# Patient Record
Sex: Male | Born: 1945 | Race: White | Hispanic: No | Smoking: Never smoker
Health system: Southern US, Community
[De-identification: ages and names within clinical notes are randomized; demographics above are authoritative.]

## PROBLEM LIST (undated history)

## (undated) DIAGNOSIS — I471 Supraventricular tachycardia, unspecified: Secondary | ICD-10-CM

## (undated) DIAGNOSIS — E785 Hyperlipidemia, unspecified: Secondary | ICD-10-CM

## (undated) DIAGNOSIS — I10 Essential (primary) hypertension: Secondary | ICD-10-CM

## (undated) DIAGNOSIS — C61 Malignant neoplasm of prostate: Secondary | ICD-10-CM

## (undated) DIAGNOSIS — K219 Gastro-esophageal reflux disease without esophagitis: Secondary | ICD-10-CM

## (undated) DIAGNOSIS — G4730 Sleep apnea, unspecified: Secondary | ICD-10-CM

## (undated) DIAGNOSIS — F419 Anxiety disorder, unspecified: Secondary | ICD-10-CM

## (undated) DIAGNOSIS — F32A Depression, unspecified: Secondary | ICD-10-CM

## (undated) DIAGNOSIS — N529 Male erectile dysfunction, unspecified: Secondary | ICD-10-CM

## (undated) DIAGNOSIS — E119 Type 2 diabetes mellitus without complications: Secondary | ICD-10-CM

## (undated) DIAGNOSIS — I219 Acute myocardial infarction, unspecified: Secondary | ICD-10-CM

## (undated) HISTORY — PX: PROSTATE BIOPSY: SHX241

## (undated) HISTORY — PX: FOOT SURGERY: SHX648

## (undated) HISTORY — PX: KNEE ARTHROSCOPY: SUR90

---

## 2011-07-03 ENCOUNTER — Other Ambulatory Visit (HOSPITAL_COMMUNITY): Payer: Self-pay | Admitting: Urology

## 2011-07-03 DIAGNOSIS — C61 Malignant neoplasm of prostate: Secondary | ICD-10-CM

## 2011-07-10 ENCOUNTER — Ambulatory Visit (HOSPITAL_COMMUNITY)
Admission: RE | Admit: 2011-07-10 | Discharge: 2011-07-10 | Disposition: A | Discharge status: Home / Self Care | Payer: BC Managed Care – HMO | Source: Ambulatory Visit | Attending: Urology | Admitting: Urology

## 2011-07-10 ENCOUNTER — Other Ambulatory Visit (HOSPITAL_COMMUNITY): Payer: Self-pay | Admitting: Urology

## 2011-07-10 DIAGNOSIS — C61 Malignant neoplasm of prostate: Secondary | ICD-10-CM | POA: Insufficient documentation

## 2011-07-10 DIAGNOSIS — Z1389 Encounter for screening for other disorder: Secondary | ICD-10-CM | POA: Insufficient documentation

## 2011-07-10 DIAGNOSIS — R599 Enlarged lymph nodes, unspecified: Secondary | ICD-10-CM | POA: Insufficient documentation

## 2011-07-10 LAB — CREATININE, SERUM
GFR calc Af Amer: >60 mL/min (ref >60)
GFR calc non Af Amer: >60 mL/min (ref >60)

## 2011-07-10 MED ORDER — GADOBENATE DIMEGLUMINE 529 MG/ML IV SOLN
20.0000 mL | Freq: Once | INTRAVENOUS | Status: AC | PRN
  Administered 2011-07-10: 20 mL via INTRAVENOUS

## 2013-03-06 DIAGNOSIS — G4733 Obstructive sleep apnea (adult) (pediatric): Secondary | ICD-10-CM | POA: Insufficient documentation

## 2015-06-05 DIAGNOSIS — N451 Epididymitis: Secondary | ICD-10-CM | POA: Insufficient documentation

## 2017-09-15 ENCOUNTER — Other Ambulatory Visit: Payer: Self-pay | Admitting: Urology

## 2017-09-15 DIAGNOSIS — C61 Malignant neoplasm of prostate: Secondary | ICD-10-CM

## 2017-09-22 ENCOUNTER — Ambulatory Visit
Admission: RE | Admit: 2017-09-22 | Discharge: 2017-09-22 | Disposition: A | Discharge status: Home / Self Care | Payer: Self-pay | Source: Ambulatory Visit | Attending: Urology | Admitting: Urology

## 2017-09-22 DIAGNOSIS — C61 Malignant neoplasm of prostate: Secondary | ICD-10-CM

## 2017-09-22 MED ORDER — GADOBENATE DIMEGLUMINE 529 MG/ML IV SOLN
20.0000 mL | Freq: Once | INTRAVENOUS | Status: DC | PRN
Start: 2017-09-22 — End: 2017-09-23

## 2019-10-09 ENCOUNTER — Other Ambulatory Visit: Payer: Self-pay | Admitting: Urology

## 2019-10-09 DIAGNOSIS — C61 Malignant neoplasm of prostate: Secondary | ICD-10-CM

## 2019-12-14 ENCOUNTER — Other Ambulatory Visit: Payer: Self-pay

## 2019-12-14 ENCOUNTER — Ambulatory Visit
Admission: RE | Admit: 2019-12-14 | Discharge: 2019-12-14 | Disposition: A | Discharge status: Home / Self Care | Payer: Medicare Other | Source: Ambulatory Visit | Attending: Urology | Admitting: Urology

## 2019-12-14 DIAGNOSIS — C61 Malignant neoplasm of prostate: Secondary | ICD-10-CM

## 2019-12-14 MED ORDER — GADOBENATE DIMEGLUMINE 529 MG/ML IV SOLN
20.0000 mL | Freq: Once | INTRAVENOUS | Status: AC | PRN
  Administered 2019-12-14: 20 mL via INTRAVENOUS

## 2019-12-20 ENCOUNTER — Other Ambulatory Visit (HOSPITAL_COMMUNITY): Payer: Self-pay | Admitting: Urology

## 2019-12-20 ENCOUNTER — Other Ambulatory Visit: Payer: Self-pay | Admitting: Urology

## 2019-12-20 DIAGNOSIS — C61 Malignant neoplasm of prostate: Secondary | ICD-10-CM

## 2019-12-25 ENCOUNTER — Encounter: Payer: Self-pay | Admitting: *Deleted

## 2020-01-01 ENCOUNTER — Other Ambulatory Visit: Payer: Self-pay

## 2020-01-01 ENCOUNTER — Encounter (HOSPITAL_COMMUNITY)
Admission: RE | Admit: 2020-01-01 | Discharge: 2020-01-01 | Disposition: A | Discharge status: Home / Self Care | Payer: Medicare Other | Source: Ambulatory Visit | Attending: Urology | Admitting: Urology

## 2020-01-01 ENCOUNTER — Telehealth: Payer: Self-pay | Admitting: Medical Oncology

## 2020-01-01 DIAGNOSIS — C61 Malignant neoplasm of prostate: Secondary | ICD-10-CM | POA: Insufficient documentation

## 2020-01-01 MED ORDER — TECHNETIUM TC 99M MEDRONATE IV KIT
20.4000 | PACK | Freq: Once | INTRAVENOUS | Status: AC
  Administered 2020-01-01: 20.4 via INTRAVENOUS

## 2020-01-01 NOTE — Telephone Encounter (Signed)
I called pt to introduce myself as the Prostate Nurse Navigator and the Coordinator of the Prostate [place].  1. I confirmed with the patient he is aware of his referral to the clinic 3/23, arriving at 12:30 pm.  2. I discussed the format of the clinic and the physicians he will be seeing that day.  3. I discussed where the clinic is located and how to contact me. I reviewed [place] parking, registration and COVID precautions.   4. I confirmed his address and informed him I would be mailing a packet of information and forms to be completed. I asked him to bring them with him the day of his appointment.   He voiced understanding of the above. I asked him to call me if he has any questions or concerns regarding his appointments or the forms he needs to complete.

## 2020-01-04 ENCOUNTER — Encounter: Payer: Self-pay | Admitting: Medical Oncology

## 2020-01-08 ENCOUNTER — Encounter: Payer: Self-pay | Admitting: Radiation Oncology

## 2020-01-08 ENCOUNTER — Telehealth: Payer: Self-pay | Admitting: Medical Oncology

## 2020-01-08 NOTE — Progress Notes (Signed)
GU Location of Tumor / Histology: prostatic adenocarcinoma  If Prostate Cancer, Gleason Score is (3 + 4) and PSA is (1.05) in November 2018.      Biopsies of prostate (if applicable) revealed:      Past/Anticipated interventions by urology, if any: prostate biopsies x 4, surveillance, bone scan (negative), referral to [facility], per Dr. Laney Pastor notes XRT is recommended due to patient's co morbidities.  Past/Anticipated interventions by medical oncology, if any: no  Weight changes, if any: denies  Bowel/Bladder complaints, if any: IPSS 6. SHIM 6. On avodart since 2012. Reports urinary incontinence. History of a hernia  Nausea/Vomiting, if any: denies  Pain issues, if any:  Joint pain  SAFETY ISSUES:  Prior radiation? denies  Pacemaker/ICD? denies  Possible current pregnancy? no, male patient  Is the patient on methotrexate? denies  Current Complaints / other details:  74 year old male. Widowed. Lost wife little more than 1 years ago to metastatic cancer. Mother had a hx of colon ca.  Last Tuesday 01/02/20 patient had an episode of SVT and had to be shocked back into rhythm. Patient scheduled to follow up with cardiologist on April 1st.

## 2020-01-08 NOTE — Telephone Encounter (Signed)
Spoke with patient to confirm appointment for [facility] 3/23, arriving at 12:30 pm. I reviewed location and registration. I asked him to bring completed medical forms in packet. He voiced understanding of the above.

## 2020-01-09 ENCOUNTER — Encounter: Payer: Self-pay | Admitting: Medical Oncology

## 2020-01-09 ENCOUNTER — Inpatient Hospital Stay: Payer: Medicare Other | Attending: Oncology | Admitting: Oncology

## 2020-01-09 ENCOUNTER — Encounter: Payer: Self-pay | Admitting: Radiation Oncology

## 2020-01-09 ENCOUNTER — Other Ambulatory Visit: Payer: Self-pay

## 2020-01-09 ENCOUNTER — Encounter: Payer: Self-pay | Admitting: General Practice

## 2020-01-09 ENCOUNTER — Ambulatory Visit
Admission: RE | Admit: 2020-01-09 | Discharge: 2020-01-09 | Disposition: A | Discharge status: Home / Self Care | Payer: Medicare Other | Source: Ambulatory Visit | Attending: Radiation Oncology | Admitting: Radiation Oncology

## 2020-01-09 VITALS — BP 171/85 | HR 75 | Temp 97.8°F | Resp 20 | Wt 302.0 lb

## 2020-01-09 DIAGNOSIS — C61 Malignant neoplasm of prostate: Secondary | ICD-10-CM | POA: Insufficient documentation

## 2020-01-09 DIAGNOSIS — Z923 Personal history of irradiation: Secondary | ICD-10-CM | POA: Diagnosis not present

## 2020-01-09 DIAGNOSIS — R9721 Rising PSA following treatment for malignant neoplasm of prostate: Secondary | ICD-10-CM | POA: Insufficient documentation

## 2020-01-09 DIAGNOSIS — N401 Enlarged prostate with lower urinary tract symptoms: Secondary | ICD-10-CM | POA: Diagnosis not present

## 2020-01-09 DIAGNOSIS — Z79899 Other long term (current) drug therapy: Secondary | ICD-10-CM | POA: Diagnosis not present

## 2020-01-09 HISTORY — DX: Essential (primary) hypertension: I10

## 2020-01-09 HISTORY — DX: Malignant neoplasm of prostate: C61

## 2020-01-09 NOTE — Progress Notes (Signed)
[place] Psychosocial Distress Screening Spiritual Care  Met with Brian Petersen and his son Brian Petersen in [facility] to introduce [facility] team/resources, reviewing distress screen per protocol.  The patient scored a [unspecified] on the Psychosocial Distress Thermometer which indicates [unspecified] distress. Also assessed for distress and other psychosocial needs.   ONCBCN DISTRESS SCREENING 01/09/2020  Screening Type Initial Screening  Family Problem type Other (comment)  Emotional problem type Depression;Isolation/feeling alone  Spiritual/Religous concerns type Relating to God;Loss of sense of purpose  Physical Problem type Pain  Referral to support programs Yes   [name] notes that most of his distress comes from grieving the death of his wife just a year ago. Provided empathic listening, pastoral reflection, and normalization of feelings related to grief and diagnosis. Encouraged support programming as a means to bridging social isolation.   Follow up needed: No. Per [name], no other needs at this time, but he is aware of ongoing [place] team/programming availability as needed/desired.   [place], [place], [facility] Pager [phone] Voicemail [phone]

## 2020-01-09 NOTE — Progress Notes (Signed)
Radiation Oncology         [phone] ________________________________  Multidisciplinary Prostate Cancer Clinic  Initial Radiation Oncology Consultation  Name: Brian Petersen MRN: [id number]  Date: 01/09/2020  DOB: 1945/11/23  [id number], Brian Rinks, MD  Brian Bring, MD   REFERRING PHYSICIAN: [name], MD  DIAGNOSIS: 74 y.o. gentleman with stage T1c adenocarcinoma of the prostate with a Gleason's score of 3+4 and a PSA of 5.44 (adjusted for Avodart)    ICD-10-CM   1. Malignant neoplasm of prostate ([place])  [name] is a 74 y.o. gentleman.  He was initially noted to have an elevated PSA of 4.3 back in 10/2010 by his PCP. He proceeded to biopsy on 11/13/2010 with Dr. Lerry Liner in [place], [place]., which revealed low volume Gleason 3+3 prostate cancer.  He was referred to Dr. Danny Lawless and Dr. Alinda Money in 04/2011 to discuss treatment options, and he opted for active surveillance. Prostate MRI performed in 06/2011 showed no transcapsular spread, seminal vesicle invasion, or adenopathy. He underwent surveillance biopsies in 06/2011, 11/2012, and 02/2015, which all showed stable Gleason 3+3 disease. He also started on Avodart in 07/2011 and has continued taking this daily.  On 09/22/2017, he underwent surveillance prostate MRI, which revealed a 1.2 cm PI-RADS 4 left posterolateral peripheral zone apical lesion, with prostate volume of 26.5 cc. He proceeded to MRI fusion biopsy on 10/05/2017, which revealed slight upstaging to Gleason 3+4 disease in 2/16 cores. He elected to continue in active surveillance.  His PSA continued to rise, from 1 in 02/2017 to 2.72 (5.4 corrected for finasteride) in 11/2019. He underwent surveillance prostate MRI on 12/14/2019, which revealed an enlarging PI-RADS 4 area showing suspicion for extracapsular extension and, based on location, possible neurovascular bundle involvement There was no evidence of lymphadenopathy or bone  metastases. Bone scan was performed on 01/01/2020 and also showed no evidence of skeletal metastasis. Based on review of imaging and discussion with radiology today in conference, these findings are felt to be equivocal at best, not definitive for ECE.  The patient reviewed the biopsy results with his urologist and he has kindly been referred today to the multidisciplinary prostate cancer clinic for presentation of pathology and radiology studies in our conference for discussion of potential radiation treatment options and clinical evaluation.    PREVIOUS RADIATION THERAPY: No  PAST MEDICAL HISTORY:  has a past medical history of Hypertension and Prostate cancer ([place]).    PAST SURGICAL HISTORY: Past Surgical History:  Procedure Laterality Date  . FOOT SURGERY    . KNEE ARTHROSCOPY    . PROSTATE BIOPSY     2018, 2014, 2012, 2012    FAMILY HISTORY: family history includes Colon cancer in his mother.  SOCIAL HISTORY:  reports that he has never smoked. He has never used smokeless tobacco. He reports previous alcohol use. He reports that he does not use drugs. He lost his wife to metastatic breast cancer in 2020.  ALLERGIES: Cyprodenate  MEDICATIONS:  Current Outpatient Medications  Medication Sig Dispense Refill  . albuterol (VENTOLIN HFA) 108 (90 Base) MCG/ACT inhaler Inhale into the lungs.    Marland Kitchen aspirin 325 MG tablet Take by mouth.    Marland Kitchen glucose blood (ONETOUCH ULTRA) test strip USE ONE STRIP TO CHECK GLUCOSE TWICE DAILY    . OneTouch Delica Lancets 99991111 MISC Apply topically.    . ALPRAZolam (XANAX) 1 MG tablet     . amLODipine (NORVASC) 5 MG tablet     .  baclofen (LIORESAL) 10 MG tablet     . clopidogrel (PLAVIX) 75 MG tablet     . dutasteride (AVODART) 0.5 MG capsule     . empagliflozin (JARDIANCE) 25 MG TABS tablet Take by mouth.    . ezetimibe (ZETIA) 10 MG tablet     . fenofibrate (TRICOR) 48 MG tablet     . furosemide (LASIX) 20 MG tablet     . gabapentin (NEURONTIN) 300 MG  capsule Take by mouth.    . Glucosamine-Chondroitin 250-200 MG TABS Take by mouth.    . hydrALAZINE (APRESOLINE) 100 MG tablet     . imipramine (TOFRANIL) 50 MG tablet     . KLOR-CON M20 20 MEQ tablet     . losartan-hydrochlorothiazide (HYZAAR) 100-25 MG tablet     . Magnesium Chloride 64 MG TBEC Take by mouth.    . metFORMIN (GLUCOPHAGE-XR) 750 MG 24 hr tablet Take by mouth.    . metoprolol succinate (TOPROL-XL) 50 MG 24 hr tablet     . pantoprazole (PROTONIX) 40 MG tablet     . rosuvastatin (CRESTOR) 20 MG tablet     . TOUJEO SOLOSTAR 300 UNIT/ML Solostar Pen      No current facility-administered medications for this encounter.    REVIEW OF SYSTEMS:  On review of systems, the patient reports that he is doing well overall. He denies any chest pain, shortness of breath, cough, fevers, chills, night sweats, unintended weight changes. He denies any bowel disturbances, and denies abdominal pain, nausea or vomiting. He denies any new musculoskeletal or joint aches or pains. His IPSS was 6, indicating mild urinary symptoms. He reports urinary urgency and some frequency. His SHIM was 6, indicating he has severe erectile dysfunction. A complete review of systems is obtained and is otherwise negative.   PHYSICAL EXAM:  Wt Readings from Last 3 Encounters:  01/09/20 (!) 302 lb (137 kg)  01/01/20 293 lb (132.9 kg)   Temp Readings from Last 3 Encounters:  01/09/20 97.8 F (36.6 C)   BP Readings from Last 3 Encounters:  01/09/20 (!) 171/85   Pulse Readings from Last 3 Encounters:  01/09/20 75   Pain Assessment Pain Score: 0-No pain/10  In general this is a well appearing Caucasian male in no acute distress. He is alert and oriented x4 and appropriate throughout the examination. HEENT reveals that the patient is normocephalic, atraumatic. EOMs are intact. PERRLA. Skin is intact without any evidence of gross lesions. Cardiovascular exam reveals a regular rate and rhythm, no clicks rubs or  murmurs are auscultated. Chest is clear to auscultation bilaterally. Lymphatic assessment is performed and does not reveal any adenopathy in the cervical, supraclavicular, axillary, or inguinal chains. Abdomen has active bowel sounds in all quadrants and is intact. The abdomen is soft, non tender, non distended. Lower extremities are negative for pretibial pitting edema, deep calf tenderness, cyanosis or clubbing.  KPS = 80  100 - Normal; no complaints; no evidence of disease. 90   - Able to carry on normal activity; minor signs or symptoms of disease. 80   - Normal activity with effort; some signs or symptoms of disease. 33   - Cares for self; unable to carry on normal activity or to do active work. 60   - Requires occasional assistance, but is able to care for most of his personal needs. 50   - Requires considerable assistance and frequent medical care. 52   - Disabled; requires special care and assistance. 30   -  Severely disabled; hospital admission is indicated although death not imminent. 73   - Very sick; hospital admission necessary; active supportive treatment necessary. 10   - Moribund; fatal processes progressing rapidly. 0     - Dead  Karnofsky DA, Abelmann WH, Craver LS and Burchenal JH [phone]) The use of the nitrogen mustards in the palliative treatment of carcinoma: with particular reference to bronchogenic carcinoma Cancer 1 [id number]   LABORATORY DATA:  No results found for: WBC, HGB, HCT, MCV, PLT No results found for: NA, K, CL, CO2 No results found for: ALT, AST, GGT, ALKPHOS, BILITOT   RADIOGRAPHY: NM Bone Scan Whole Body  Result Date: 01/01/2020 CLINICAL DATA:  Prostate cancer.  PSA equal 2.7. EXAM: NUCLEAR MEDICINE WHOLE BODY BONE SCAN TECHNIQUE: Whole body anterior and posterior images were obtained approximately 3 hours after intravenous injection of radiopharmaceutical. RADIOPHARMACEUTICALS:  20.4 mCi Technetium-58m MDP IV COMPARISON:  None. FINDINGS: No abnormal  accumulation of radiotracer within the axillary or appendicular skeleton to suggest metastatic prostate cancer. Focal uptake at the LEFT sternoclavicular junction is favored degenerative. Degenerate uptake noted along the costal margin. Degenerate uptake noted in the medial compartment LEFT knee. IMPRESSION: 1. No scintigraphic evidence skeletal metastasis. 2. Multiple foci of degenerative bone disease. Electronically Signed   By: Suzy Bouchard M.D.   On: 01/01/2020 16:38   MR PROSTATE W WO CONTRAST  Result Date: 12/14/2019 CLINICAL DATA:  Elevated PSA. Prior biopsy. EXAM: MR PROSTATE WITHOUT AND WITH CONTRAST TECHNIQUE: Multiplanar multisequence MRI images were obtained of the pelvis centered about the prostate. Pre and post contrast images were obtained. CONTRAST:  72mL MULTIHANCE GADOBENATE DIMEGLUMINE 529 MG/ML IV SOLN Creatinine was obtained on site at [facility] at [address] Results: Creatinine 0.9 mg/dL. COMPARISON:  September 22, 2017 FINDINGS: Prostate: Area of T2 hypointensity in the left mid gland tracking towards the apex, associated with low signal on ADC and mild capsular bulging of the left hemi prostate. This capsular bulging is best identified on image 14 of series 9. This area has enlarged since the previous study, measuring approximately 13 mm on the T2 weighted sequence, (image 13, series 20) previously 8-9 mm. When measured on the ADC this area measures approximately 10 mm. Craniocaudal extent on today's study approximately 13 mm. This is also associated with subtle restricted diffusion on the high B value sequence though there is limitation of the sequence secondary to susceptibility artifact. There is early enhancement in this location as well also appearing slightly larger than on the previous exam. (Image 14, series 9) irregularity along the capsule adjacent to the neurovascular bundle similar findings on the isotropic T2 weighted images, (image 86 of series 10 along  the left prosthetic margin, also image 87. This is at approximately the 5 to 7 o'clock position. No high-risk lesion in the transitional zone Volume: 29 cc Transcapsular spread: Suspected along the left prostate margin towards the apex as described above. Seminal vesicle involvement: Absent but with stable atrophy of the left seminal vesicle. Neurovascular bundle involvement: Left neurovascular bundle at risk given findings towards the left prostate apex. Pelvic adenopathy: Absent Bone metastasis: Absent Other findings: None IMPRESSION: 1. Enlarging PIRADS category 4 area showing suspicion for extracapsular extension and based on location neurovascular bundle involvement. 2. No evidence of lymphadenopathy or bone metastases. 3. 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 Electronically Signed   By: Zetta Bills M.D.   On: 12/14/2019 11:56      IMPRESSION/PLAN: 74 y.o. gentleman with  Stage T1c adenocarcinoma of the prostate with a PSA of 5.44 (adjusted for Avodart) and a Gleason score of 3+4.    We discussed the patient's workup and outlined the nature of prostate cancer in this setting. The patient's T stage, Gleason's score, and PSA put him into the favorable intermediate risk group. Accordingly, he is eligible for a variety of potential treatment options including brachytherapy, 5.5 weeks of external radiation, or prostatectomy. We discussed the available radiation techniques, and focused on the details and logistics of delivery. We discussed and outlined the risks, benefits, short and long-term effects associated with radiotherapy and compared and contrasted these with prostatectomy. We discussed the role of SpaceOAR in reducing the rectal toxicity associated with radiotherapy.  He was encouraged to ask questions that were answered to his stated satisfaction.  At the end of our conversation, the patient would like to proceed with 5.5 weeks of external beam radiation therapy in [place], [place] which  would be much closer to his home. We will therefore make the referral to Dr. Bess Harvest in [place]. We enjoyed meeting the patient today and wished him the best with his treatment.     Nicholos Johns, PA-C    Tyler Pita, MD  [facility] Direct Dial: [phone]  Fax: [phone] [email or web address]  Skype  [email or web address]   This document serves as a record of services personally performed by Tyler Pita, MD and Freeman Caldron, PA-C. It was created on their behalf by Wilburn Mylar, a trained medical scribe. The creation of this record is based on the scribe's personal observations and the provider's statements to them. This document has been checked and approved by the attending provider.

## 2020-01-09 NOTE — Consult Note (Signed)
[facility] 01/09/2020    Alphonse Guild. [place]         MRN: [id number]  PRIMARY CARE:  Cathlean Cower, MD    REFERRING:  Aloha Gell. Hurt, MD  DOB: 1946-08-03, 74 year old Male  PROVIDER:  [name], M.D.  SSN: [id number]  LOCATION:  [facility] [phone]    CC/HPI: CC: Prostate Cancer   PCP: Dr. Cathlean Cower  Location of consult: [facility]   Mr. Cuvelier is a 74 year old gentleman with a past medical history of coronary artery disease s/p myocardial infarction in 2001 (on Plavix and ASA but does not have stents), hyperlipidemia, hypertension, GERD, sleep apnea, diabetes, and obesity. He was initially noted to have an elevated PSA of 4.3 in January 2012 by his PCP at the time, Dr. Lonia Mad in [place], which prompted urologic referral to Dr. Clide Deutscher. Dr. 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 He underwent staging studies by Dr. Lerry Liner on 12/02/10 including a bone scan and CT scan with neither study demonstrating evidence for metastatic disease. He has no family history of prostate cancer and his father died of a heart attack at age 80. He has discussed his options for treatment/management with Dr. Lerry Liner and Dr. Danny Lawless in radiation oncology prior to his initial consultation with me in July 2012. After reviewing options, he was interested in an active surveillance approach. He was managed with active surveillance until December 2018 when he was noted to have progression with upgraded Gleason 3+4=7 adenocarcinoma noted on biopsy. In August 2020, his PSA increased form a baseline of around 1 up to 2.7 (on 5 ARI). This then stabilized and was 2.72 in January 2021 but prompted a repeat MRI of the prostate in February 2021 that confirmed a  PIRADS 4 lesion but now with probable EPE suggesting development of locally advanced disease. He did undergo recent bone scan based on his concern for locally advanced disease. This was negative for metastatic disease.   Of note, his wife passed away last year and he has been quite depressed and lonely. He is now very interested in starting a new relationship for this reason. He has refractory erectile dysfunction and expressed strong interest in a penile prosthesis at his last visit, which may be his only viable option for treatment.   Notably, he also informs me that he had an episode of SVT requiring evaluation the emergency department last week. He is scheduled to follow-up with cardiology next week.   Surveillance:  Sep 2012 (26 core biopsy): 4/26 cores -- L lateral apex (20%, 3+3=6), L lateral mid (15%, 3+3=6, PNI), L mid (40% and 10% with 2/2 cores, 3+3=6), Vol 60 cc  Sep 2012 (MRI of the prostate): Hypointensity at left lateral mid/apical prostate. No SVI, EPE, or LAD. No transition zone abnormalities.  Feb 2014: 12 core biopsy: 2/12 cores - L lateral apex (25%, 3+3=6), L lateral mid (5%, 3+3=6), Vol 38.5 cc  May 2016: 12 core biopsy: 1/12 cores - L apex (30%, 3+3=6, PNI), Vol 41.4 cc  Dec 2018: MRI of prostate - 1.2 cm PI-RADS 4 lesion at left posterolateral apex  Dec 2018: MR/US fusion biopsy -2/16 cores positive - L mid (10%, 3+4=7), L lateral mid (30%, 3+4=7), MR  target lesion (negative), Vol 32.1 cc, PSAD 0.22 Nov 2019: MRI - 1.3 cm PIRADS 4 lesion at left posterolateral prostate with evidence of probably EPE   Family history: None.    Urinary function: IPSS is 6. He remains on dutasteride  Erectile function: SHIM score is 6. He has failed PDE 5 inhibitors.     ALLERGIES: Ceftin TABS    MEDICATIONS: Crestor  Hydrochlorothiazide  Metformin Hcl  Metformin Hcl  Aspirin Ec 325 mg tablet, delayed release  Baclofen  Cinnamon  Clopidogrel  CPAP  Dutasteride 0.5 mg capsule    Ezetimibe  Fenofibrate  Fish Oil  Furosemide 20 mg tablet Oral  Hydralazine Hcl  Imipramine Hcl  Klor-Con M20 TBCR Oral  Losartan Potassium-HCTZ 100-25 MG Oral Tablet Oral  Magnesium  Metoprolol Tartrate 50 mg tablet Oral  Onetouch Ultra Blue Test Strp strip In Vitro  Osteo Bi-Flex Regular Strength TABS Oral  Pantoprazole Sodium  Rosuvastatin Calcium  Toujeo Solostar  Turmeric  Vitamin B Complex  Vitamin B12  Xanax 1 mg tablet  Zinc     GU PSH: Prostate Needle Biopsy - 10/06/2017       PSH Notes: Arthroscopy Knee Right, Foot Surgery Left, Closed Treatment Of Clavicular Fracture On The Left   NON-GU PSH: Knee Arthroscopy; Dx - 2012 Surgical Pathology, Gross And Microscopic Examination For Prostate Needle - 10/06/2017 Treat Clavicle Fracture - 2012         GU PMH: Weak Urinary Stream - 12/19/2019 Hematospermia - 2017 BPH w/LUTS, Benign prostatic hyperplasia (BPH) with straining on urination - 2017 ED due to arterial insufficiency, Erectile dysfunction due to arterial insufficiency - 2017 Prostate Cancer, Prostate cancer - 2017 Epididymitis, Epididymitis, left - 2016 Urinary incontinence, Unspec, Urinary incontinence - 2016 Urinary Tract Inf, Unspec site, Pyuria - 2016 Urinary Urgency, Urinary urgency - 2016 History of urolithiasis, Nephrolithiasis - 2014 Neoplasm of unspecified behavior of unspecified kidney, 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 Calton Dach which prompted urologic referral to Dr. Lerry Liner. Dr. 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 He underwent staging studies by Dr. Lerry Liner on 12/02/10 including a bone scan  and CT scan with neither study demonstrating evidence for metastatic disease. He has no family history of prostate cancer and his father died of a heart attack at age 65. He has discussed his options for treatment/management with Dr. Lerry Liner and Dr. Danny Lawless in radiation oncology prior to his initial consultation with me in July 2012. After reviewing options, he was interested in an active surveillance approach and underwent further evaluation including repeat biopsy and MRI evaluation.   He was managed with active surveillance until December 2018 when he was noted to have progression with upgraded Gleason 3+4=7 adenocarcinoma noted on biopsy.   Initial diagnosis: January 2012  TNM stage: cT1c N0 M0  PSA at diagnosis: 4.3  Gleason score: 3+3=6  Biopsy (11/13/10 - read by Dr. Gari Crown at [facility], Case # [phone]): 1/12 cores -- Left sided biopsy tissue (< 10%), Right sided biopsy tissue (benign)  Prostate volume: 27 cc  PSAD: 0.16   Baseline urinary function: He has minimal voiding symptoms. IPSS:  3.  Baseline erectile function: He has preserved erectile dysfunction. SHIM score: 24.   Surveillance:  Sep 2012 (26 core biopsy): 4/26 cores -- L lateral apex (20%, 3+3=6), L lateral mid (15%, 3+3=6, PNI), L mid (40% and 10% with 2/2 cores, 3+3=6), Vol 60 cc  Sep 2012 (MRI of the prostate): Hypointensity at left lateral mid/apical prostate. No SVI, EPE, or LAD. No transition zone abnormalities.  Feb 2014: 12 core biopsy: 2/12 cores - L lateral apex (25%, 3+3=6), L lateral mid (5%, 3+3=6), Vol 38.5 cc  May 2016: 12 core biopsy: 1/12 cores - L apex (30%, 3+3=6, PNI), Vol 41.4 cc  Dec 2018: MRI of prostate - 1.2 cm PI-RADS 4 lesion at left posterolateral apex  Dec 2018: MR/US fusion biopsy -2/16 cores positive - L mid (10%, 3+4=7), L lateral mid (30%, 3+4=7), MR target lesion (negative), Vol 32.1 cc, PSAD 0.03    2) Urolithiasis: He also has a history of calcium oxalate urolithiasis. He most  recently was found to have incidentally detected bilateral ureteral stones during his metastatic staging studies for his prostate cancer. He underwent ureteroscopic removal of his left ureteral stone but his right ureteral stone was unable to be removed. He required multiple procedures with difficulty reported due to his prostate anatomy which made semirigid ureteroscopy difficult. He apparently had an IVP in May 2012 after these procedures which did not demonstrate any residual obstructing stones.   3) Left complex renal cyst: In February 2012, he was incidentally found to have bilateral renal cysts with a 1.7 cm hyperdense cyst which was recommended to be followed. 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   4) Erectile dysfunction: He has erectile dysfunction which has been refractory to treatment with Cialis and Levitra on demand medications.   5) BPH/LUTS: His baseline symptoms include urgency and spraying of his stream intermittently.   Current treatment: Avodart (Began Oct 2012)   ** He has a history of coronary artery disease and had a myocardial infarction in 2001. He has no cardiac stents but is chronically treated with Plavix and aspirin.     NON-GU PMH: Depression Diabetes Type 2 GERD Hypercholesterolemia Hypertension Myocardial Infarction Obesity Sleep Apnea    FAMILY HISTORY: Colon Cancer - Mother Father Deceased At Age67 ___ - Runs In Family Heart Disease - Father nephrolithiasis - Mother Prostate Cancer - No Family History   SOCIAL HISTORY: Marital Status: Married Preferred Language: English; Ethnicity: Not Hispanic Or Latino; Race: White Current Smoking Status: Patient has never smoked.  Drinks 4+ caffeinated drinks per day.     Notes: Occupation:, Marital History - Currently Married, Alcohol Use, Never A Smoker   REVIEW OF SYSTEMS:     GU Review Male:  Patient denies frequent  urination, hard to postpone urination, burning/ pain with urination, get up at night to urinate, leakage of urine, stream starts and stops, trouble starting your streams, and have to strain to urinate .    Gastrointestinal (Upper):  Patient denies nausea and vomiting.    Gastrointestinal (Lower):  Patient denies diarrhea and constipation.    Constitutional:  Patient denies fever, night sweats, weight loss, and fatigue.    Skin:  Patient denies skin rash/ lesion and itching.    Eyes:  Patient denies blurred vision and double vision.    Ears/ Nose/ Throat:  Patient denies sore throat and sinus problems.    Hematologic/Lymphatic:  Patient denies swollen glands  and easy bruising.    Cardiovascular:  Patient denies leg swelling and chest pains.    Respiratory:  Patient denies cough and shortness of breath.    Endocrine:  Patient denies excessive thirst.    Musculoskeletal:  Patient denies back pain and joint pain.    Neurological:  Patient denies headaches and dizziness.    Psychologic:  Patient denies depression and anxiety.    VITAL SIGNS: None     MULTI-SYSTEM PHYSICAL EXAMINATION:      Constitutional: Well-nourished. No physical deformities. Normally developed. Good grooming.            PAST DATA REVIEWED:   Source Of History:  Patient    12/11/19 08/02/19 06/16/19 11/11/18 05/04/18 09/15/17 02/26/17 08/26/16  PSA  Total PSA 2.72 ng/mL 2.6 ng/dl 2.71 ng/mL 1.94 ng/mL 1.26 ng/mL 1.05 ng/mL 1.04 ng/dl 0.62 ng/dl    PROCEDURES: None   ASSESSMENT:     ICD-10 Details  1 GU:  Prostate Cancer - C61   2  BPH w/LUTS - N40.1   3  Weak Urinary Stream - R39.12    PLAN:   Document  Letter(s):  Created for Patient: Clinical Summary   Notes:  1. Intermediate risk prostate cancer: I had a long and detailed discussion with Mr. Mccreedy and his son today. We reviewed his imaging and pathology extensively in our tumor Board Conference prior to his visit today. He does have some equivocal concern  for possible early development of extraprostatic extension although this is far from definitive. We reviewed the option of ongoing surveillance versus treatment. He understands that there may be an increased risk for progression of his disease based on some of these findings concerning his imaging. After further discussion and our prior discussions, he does wish to proceed with definitive therapy for cure at this time.   The patient was counseled about the natural history of prostate cancer and the standard treatment options that are available for prostate cancer. It was explained to him how his age and life expectancy, clinical stage, Gleason score, and PSA affect his prognosis, the decision to proceed with additional staging studies, as well as how that information influences recommended treatment strategies. We discussed the roles for active surveillance, radiation therapy, surgical therapy, androgen deprivation, as well as ablative therapy options for the treatment of prostate cancer as appropriate to his individual cancer situation. We discussed the risks and benefits of these options with regard to their impact on cancer control and also in terms of potential adverse events, complications, and impact on quality of life particularly related to urinary and sexual function. The patient was encouraged to ask questions throughout the discussion today and all questions were answered to his stated satisfaction. In addition, the patient was provided with and/or directed to appropriate resources and literature for further education about prostate cancer and treatment options.   After reviewing options for treatment, he understands the increased perioperative risks for surgical therapy considering his medical comorbidities as well as the increased risk of incontinence in his 89s. We discussed the option of primary radiotherapy. Although there is some mild concern for possible extraprostatic disease, it is not  definitive that he has locally advanced disease. Furthermore, considering the recent loss of his wife which has resulted in some depression/mood changes, the fact that he has cardiovascular disease and diabetes, and his concern about improving his erectile function for his quality of life so that he can feel more confident potentially starting a new relationship, we discussed  the potential risks and benefits of androgen deprivation therapy with radiation and I agreed with him that the risks may outweigh the benefits for him.   As such, he is most interested in proceeding with radiotherapy alone without androgen deprivation. He is scheduled to meet with Dr. Tammi Klippel later this afternoon. He would likely want to proceed with radiation therapy closer to home in [place], [place]. Assuming this is the case, I will communicate with his radiation oncologist and see how I can assist. I will plan to arrange appropriate follow-up for ongoing cancer surveillance and further evaluation of his lower urinary tract symptoms post treatment.   2. BPH/LUTS: His symptoms remain relatively well controlled. Continue dutasteride 0.5 mg.   3. Erectile dysfunction: He does wish to pursue further treatment for his erectile dysfunction. He has failed PDE 5 inhibitors and has not been keen on a vacuum erection device nor intracavernosal injection therapy when previously tried. He therefore may be most interested in proceeding with surgical treatment with a penile prosthesis. We have agreed to discuss his further following treatment of his prostate cancer to further assess his desire to proceed with aggressive treatment.   Cc: Dr. Cathlean Cower  Dr. Tyler Pita  Dr. Zola Button  Dr. Bess Harvest    E & M CODES: I spent 38 minutes total time with the patient.

## 2020-01-09 NOTE — Progress Notes (Signed)
Reason for the request: Prostate cancer     HPI: I was asked by Dr. Alinda Money to evaluate Mr. Moravec for evaluation of prostate cancer.  He is a 74 year old man with history of hypertension, diabetes and coronary artery disease and was diagnosed with prostate cancer dating back to 2012.  At that time he had a PSA 4.3 biopsy obtained by Dr. Lerry Liner showed a Gleason score of 3+3 equal 6 and 1 of 12 cores.  He had been on active surveillance since that time and has had multiple biopsies in the past.  In 2018 he had an MRI of the prostate which showed a PI-RADS 4 in the left posterior lateral apex which should resulted in a biopsy which showed 2016 cores +3 +4 equal 7 in the left mid and the left mid lateral prostate.  His PSA at that time was 1.07.  Subsequently his PSA was as high as 2.72 in February 2021.  Repeat MRI showed a enlarging PI-RADS category 4 lesion with questionable extracapsular extension.  Based on these findings he was referred to the prostate cancer multidisciplinary clinic to discuss treatment options.  He did have a bone scan obtained on March 15 which showed no evidence of metastatic disease.  Clinically, he remains active and continues to have many projects.  He denies any specific urinary symptoms at this time although he does report some urinary frequency and nocturia but he is on Lasix.  He has also battled issues with depression related to the death of his wife in the last year.  He does not report any headaches, blurry vision, syncope or seizures. Does not report any fevers, chills or sweats.  Does not report any cough, wheezing or hemoptysis.  Does not report any chest pain, palpitation, orthopnea or leg edema.  Does not report any nausea, vomiting or abdominal pain.  Does not report any constipation or diarrhea.  Does not report any skeletal complaints.    Does not report frequency, urgency or hematuria.  Does not report any skin rashes or lesions. Does not report any heat or cold  intolerance.  Does not report any lymphadenopathy or petechiae.  Does not report any anxiety or depression.  Remaining review of systems is negative.    Past Medical History:  Diagnosis Date  . Hypertension   . Prostate cancer [facility])   :  Past Surgical History:  Procedure Laterality Date  . FOOT SURGERY    . KNEE ARTHROSCOPY    . PROSTATE BIOPSY     2018, 2014, 2012, 2012  :   Current Outpatient Medications:  .  albuterol (VENTOLIN HFA) 108 (90 Base) MCG/ACT inhaler, Inhale into the lungs., Disp: , Rfl:  .  ALPRAZolam (XANAX) 1 MG tablet, , Disp: , Rfl:  .  amLODipine (NORVASC) 5 MG tablet, , Disp: , Rfl:  .  aspirin 325 MG tablet, Take by mouth., Disp: , Rfl:  .  baclofen (LIORESAL) 10 MG tablet, , Disp: , Rfl:  .  clopidogrel (PLAVIX) 75 MG tablet, , Disp: , Rfl:  .  dutasteride (AVODART) 0.5 MG capsule, , Disp: , Rfl:  .  empagliflozin (JARDIANCE) 25 MG TABS tablet, Take by mouth., Disp: , Rfl:  .  ezetimibe (ZETIA) 10 MG tablet, , Disp: , Rfl:  .  fenofibrate (TRICOR) 48 MG tablet, , Disp: , Rfl:  .  furosemide (LASIX) 20 MG tablet, , Disp: , Rfl:  .  gabapentin (NEURONTIN) 300 MG capsule, Take by mouth., Disp: , Rfl:  .  Glucosamine-Chondroitin 250-200 MG TABS, Take by mouth., Disp: , Rfl:  .  glucose blood (ONETOUCH ULTRA) test strip, USE ONE STRIP TO CHECK GLUCOSE TWICE DAILY, Disp: , Rfl:  .  hydrALAZINE (APRESOLINE) 100 MG tablet, , Disp: , Rfl:  .  imipramine (TOFRANIL) 50 MG tablet, , Disp: , Rfl:  .  KLOR-CON M20 20 MEQ tablet, , Disp: , Rfl:  .  losartan-hydrochlorothiazide (HYZAAR) 100-25 MG tablet, , Disp: , Rfl:  .  Magnesium Chloride 64 MG TBEC, Take by mouth., Disp: , Rfl:  .  metFORMIN (GLUCOPHAGE-XR) 750 MG 24 hr tablet, Take by mouth., Disp: , Rfl:  .  metoprolol succinate (TOPROL-XL) 50 MG 24 hr tablet, , Disp: , Rfl:  .  OneTouch Delica Lancets [id number] MISC, Apply topically., Disp: , Rfl:  .  pantoprazole (PROTONIX) 40 MG tablet, , Disp: , Rfl:  .   rosuvastatin (CRESTOR) 20 MG tablet, , Disp: , Rfl:  .  TOUJEO SOLOSTAR 300 UNIT/ML Solostar Pen, , Disp: , Rfl: :  Allergies  Allergen Reactions  . Cyprodenate   :  Family History  Problem Relation Age of Onset  . Colon cancer Mother   . Prostate cancer Neg Hx   . Pancreatic cancer Neg Hx   . Breast cancer Neg Hx   :  Social History   Socioeconomic History  . Marital status: Widowed    Spouse name: Not on file  . Number of children: 2  . Years of education: Not on file  . Highest education level: Not on file  Occupational History    Comment: retired   Tobacco Use  . Smoking status: Never Smoker  . Smokeless tobacco: Never Used  Substance and Sexual Activity  . Alcohol use: Not Currently  . Drug use: Never  . Sexual activity: Not Currently  Other Topics Concern  . Not on file  Social History Narrative   Patient lost wife to cancer in 2020. Patient has two adult children, Race ([place], [place]) and Lenna [place]).   Social Determinants of Health   Financial Resource Strain:   . Difficulty of Paying Living Expenses:   Food Insecurity:   . Worried About Charity fundraiser in the Last Year:   . Arboriculturist in the Last Year:   Transportation Needs:   . Film/video editor (Medical):   Marland Kitchen Lack of Transportation (Non-Medical):   Physical Activity:   . Days of Exercise per Week:   . Minutes of Exercise per Session:   Stress:   . Feeling of Stress :   Social Connections:   . Frequency of Communication with Friends and Family:   . Frequency of Social Gatherings with Friends and Family:   . Attends Religious Services:   . Active Member of Clubs or Organizations:   . Attends Archivist Meetings:   Marland Kitchen Marital Status:   Intimate Partner Violence:   . Fear of Current or Ex-Partner:   . Emotionally Abused:   Marland Kitchen Physically Abused:   . Sexually Abused:   :  Pertinent items are noted in HPI.  Exam:  General appearance: alert and cooperative  appeared without distress. Head: atraumatic without any abnormalities. Eyes: conjunctivae/corneas clear. PERRL.  Sclera anicteric. Throat: lips, mucosa, and tongue normal; without oral thrush or ulcers. Resp: clear to auscultation bilaterally without rhonchi, wheezes or dullness to percussion. Cardio: regular rate and rhythm, S1, S2 normal, no murmur, click, rub or gallop GI: soft, non-tender; bowel sounds normal; no masses,  no organomegaly Skin: Skin color, texture, turgor normal. No rashes or lesions Lymph nodes: Cervical, supraclavicular, and axillary nodes normal. Neurologic: Grossly normal without any motor, sensory or deep tendon reflexes. Musculoskeletal: No joint deformity or effusion.    NM Bone Scan Whole Body  Result Date: 01/01/2020 CLINICAL DATA:  Prostate cancer.  PSA equal 2.7. EXAM: NUCLEAR MEDICINE WHOLE BODY BONE SCAN TECHNIQUE: Whole body anterior and posterior images were obtained approximately 3 hours after intravenous injection of radiopharmaceutical. RADIOPHARMACEUTICALS:  20.4 mCi Technetium-42m MDP IV COMPARISON:  None. FINDINGS: No abnormal accumulation of radiotracer within the axillary or appendicular skeleton to suggest metastatic prostate cancer. Focal uptake at the LEFT sternoclavicular junction is favored degenerative. Degenerate uptake noted along the costal margin. Degenerate uptake noted in the medial compartment LEFT knee. IMPRESSION: 1. No scintigraphic evidence skeletal metastasis. 2. Multiple foci of degenerative bone disease. Electronically Signed   By: Suzy Bouchard M.D.   On: 01/01/2020 16:38   MR PROSTATE W WO CONTRAST  Result Date: 12/14/2019 CLINICAL DATA:  Elevated PSA. Prior biopsy. EXAM: MR PROSTATE WITHOUT AND WITH CONTRAST TECHNIQUE: Multiplanar multisequence MRI images were obtained of the pelvis centered about the prostate. Pre and post contrast images were obtained. CONTRAST:  20mL MULTIHANCE GADOBENATE DIMEGLUMINE 529 MG/ML IV SOLN  Creatinine was obtained on site at [place] at [address] Results: Creatinine 0.9 mg/dL. COMPARISON:  September 22, 2017 FINDINGS: Prostate: Area of T2 hypointensity in the left mid gland tracking towards the apex, associated with low signal on ADC and mild capsular bulging of the left hemi prostate. This capsular bulging is best identified on image 14 of series 9. This area has enlarged since the previous study, measuring approximately 13 mm on the T2 weighted sequence, (image 13, series 20) previously 8-9 mm. When measured on the ADC this area measures approximately 10 mm. Craniocaudal extent on today's study approximately 13 mm. This is also associated with subtle restricted diffusion on the high B value sequence though there is limitation of the sequence secondary to susceptibility artifact. There is early enhancement in this location as well also appearing slightly larger than on the previous exam. (Image 14, series 9) irregularity along the capsule adjacent to the neurovascular bundle similar findings on the isotropic T2 weighted images, (image 86 of series 10 along the left prosthetic margin, also image 87. This is at approximately the 5 to 7 o'clock position. No high-risk lesion in the transitional zone Volume: 29 cc Transcapsular spread: Suspected along the left prostate margin towards the apex as described above. Seminal vesicle involvement: Absent but with stable atrophy of the left seminal vesicle. Neurovascular bundle involvement: Left neurovascular bundle at risk given findings towards the left prostate apex. Pelvic adenopathy: Absent Bone metastasis: Absent Other findings: None IMPRESSION: 1. Enlarging PIRADS category 4 area showing suspicion for extracapsular extension and based on location neurovascular bundle involvement. 2. No evidence of lymphadenopathy or bone metastases. 3. 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 Electronically Signed   By: Zetta Bills M.D.   On:  12/14/2019 11:56    Assessment and Plan:   74 year old man with prostate cancer diagnosed in 2012.  He was found to have Gleason score 3+3 equal 6, PSA of 4.3.  He has been on active surveillance and biopsy in 2018 confirmed the presence of Gleason score 4+3 equal 7 as well as slowly rising PSA currently at 2.72.  MRI of the prostate showed a enlarging PI-RADS 4 lesion with suspicion of extracapsular extension.  His case was discussed today in the prostate cancer multidisciplinary clinic.  The discussion included review of his pathology specimens with the reviewing pathologist.  Imaging studies were also reviewed with the reviewing radiologist.  Treatment options were discussed which include continued active surveillance which is not recommended at this time given the progression of his disease at this time.  His performance status remained reasonable and aggressive measures to treat his disease is warranted.  Radiation therapy would be a reasonable option at this time given his comorbidities and with put him at high risk for postoperative complications.  The role for systemic therapy and androgen deprivation was also reviewed which can certainly be omitted at this time for fear of sexual dysfunction.  The risk of developing metastatic disease long-term treatment options at that time were reviewed.  He is very concerned about this aspect of his prostate cancer given the death of his wife.  We have estimated the risk at this time remains low but certainly a possibility if his cancer left untreated.  After discussion today, he is ready to proceed with definitive therapy utilizing radiation which he will discuss further with Dr. Tammi Klippel.  45  minutes were dedicated to this visit. The time was spent on reviewing laboratory data, imaging studies, discussing treatment options,  and answering questions regarding future plan.     A copy of this consult has been forwarded to the requesting physician.

## 2020-01-09 NOTE — Progress Notes (Signed)
                               Care Plan Summary  Name: Mr. Casmir Vota DOB: [birth year]   Your Medical Team:   Urologist -  Dr. [name], [facility]  Radiation Oncologist - Dr. Tyler Pita, [facility]   Medical Oncologist - Dr. Zola Button, [place]  Recommendations: 1) Radiation     * These recommendations are based on information available as of today's consult.      Recommendations may change depending on the results of further tests or exams.  Next Steps: 1) We will make referral to Dr. Bess Harvest for radiation.    When appointments need to be scheduled, you will be contacted by [facility] and/or [facility].  Questions?  Please do not hesitate to call Cira Rue, RN, BSN, OCN at [phone] any questions or concerns.  Shirlean Mylar is your Oncology Nurse Navigator and is available to assist you while you're receiving your medical care at [facility].

## 2020-01-10 ENCOUNTER — Other Ambulatory Visit: Payer: Self-pay | Admitting: Urology

## 2020-01-10 DIAGNOSIS — C61 Malignant neoplasm of prostate: Secondary | ICD-10-CM

## 2020-01-12 ENCOUNTER — Encounter: Payer: Self-pay | Admitting: Medical Oncology

## 2020-01-15 ENCOUNTER — Telehealth: Payer: Self-pay | Admitting: Medical Oncology

## 2020-01-15 NOTE — Progress Notes (Signed)
Patient requested to be treated closer to home. Dr. Tammi Klippel referred him to Dr. Erlene Quan Mullins-radiation oncologist in [place]. Records and referral faxed to Dr. Venia Minks [phone].

## 2020-01-15 NOTE — Telephone Encounter (Signed)
Called Dr. Erlene Quan Mullin's office to confirm they received referral and faxed documents.They requested, MRI and bone scan be shared in [facility] and [facility] in radiology will send.

## 2020-01-16 ENCOUNTER — Encounter: Payer: Self-pay | Admitting: Medical Oncology

## 2020-01-16 NOTE — Progress Notes (Signed)
Spoke with patient as follow up to the [facility]. Dr. Tammi Klippel referred him to Dr. Venia Minks in [place], [place] so he can do radiation closer to home. He has not been contacted for a consult. I informed him, I faxed records and spoke with his office yesterday. I asked him to call me Monday, if does not hear from them. He voiced understanding.

## 2020-01-18 ENCOUNTER — Encounter: Payer: Self-pay | Admitting: Medical Oncology

## 2020-01-18 NOTE — Progress Notes (Signed)
Received fax from Dr. Bess Harvest- [facility] to inform us patient is scheduled 4/7 for consult.

## 2020-07-18 IMAGING — MR MR PROSTATE WO/W CM
56 series · 56 of 56 positions shown · IV contrast (20ml Multihance)
Comparison: September 22, 2017

CLINICAL DATA: Elevated PSA. Prior biopsy.

EXAM:
MR PROSTATE WITHOUT AND WITH CONTRAST
TECHNIQUE: Multiplanar multisequence MRI images were obtained of the pelvis
centered about the prostate. Pre and post contrast images were
obtained.
CONTRAST:  20mL MULTIHANCE GADOBENATE DIMEGLUMINE 529 MG/ML IV SOLN
Creatinine was obtained on site at [HOSPITAL] at [HOSPITAL].
Results: Creatinine 0.9 mg/dL.

[Series 3: bSSFP fat-sat · axial · 8.0mm · 0.74mm/px · 1 of 28 slices shown]
[im 1/28]
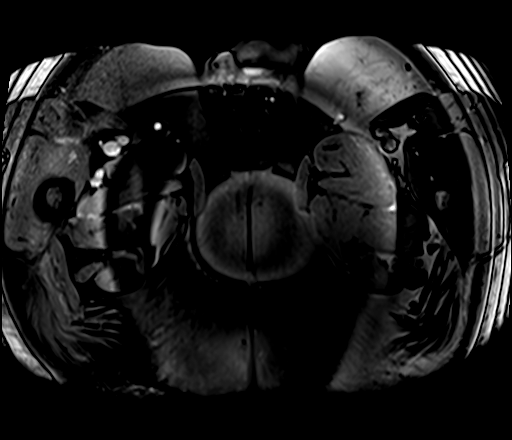

[Series 4: T1 · axial · 5.0mm · 1.25mm/px · 1 of 80 slices shown]
[im 1/80]
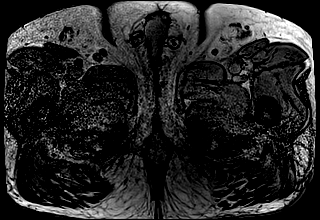

[Series 5: T2 · coronal · 3.5mm · 0.56mm/px · 1 of 23 slices shown (1 of 3)]
[im 1/23]
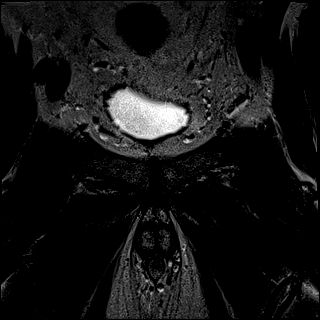

[Series 6: DWI · axial · 3.5mm · 1.75mm/px · 1 of 58 slices shown (1 of 3)]
[im 1/58]
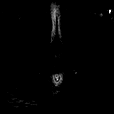

[Series 7: DWI · axial · 3.5mm · 1.75mm/px · 1 of 20 slices shown (2 of 3)]
[im 1/20]
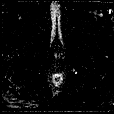

[Series 8: DWI · axial · 3.5mm · 1.56mm/px · 1 of 20 slices shown (3 of 3)]
[im 1/20]
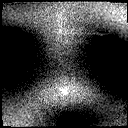

[Series 9: T2 · axial · 3.5mm · 0.56mm/px · 1 of 20 slices shown (2 of 3)]
[im 1/20]
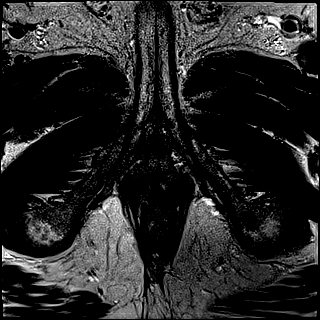

[Series 10: T2 · axial · 1.0mm · 1.04mm/px · 1 of 104 slices shown (3 of 3)]
[im 1/104]
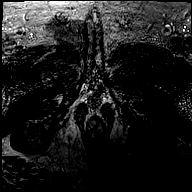

[Series 11: pre t1_twist_tra_dyn_ttc=5.3s · axial · non-contrast · 3.5mm · 0.83mm/px · 1 of 20 slices shown]
[im 1/20]
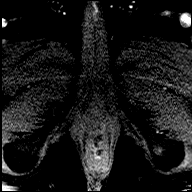

[Series 12: post t1_twist_tra_dyn-copy center · axial · 3.5mm · 0.83mm/px · 1 of 20 slices shown (1 of 24)]
[im 1/20]
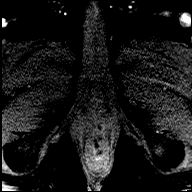

[Series 13: post t1_twist_tra_dyn-copy center · axial · 3.5mm · 0.83mm/px · 1 of 20 slices shown (2 of 24)]
[im 1/20]
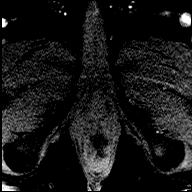

[Series 14: post t1_twist_tra_dyn-copy cent_sub_ttc=(id) · axial · 3.5mm · 0.83mm/px · 1 of 20 slices shown (1 of 23)]
[im 1/20]
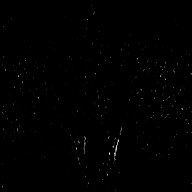

[Series 15: post t1_twist_tra_dyn-copy center · axial · 3.5mm · 0.83mm/px · 1 of 20 slices shown (3 of 24)]
[im 1/20]
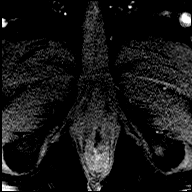

[Series 16: post t1_twist_tra_dyn-copy cent_sub_ttc=(id) · axial · 3.5mm · 0.83mm/px · 1 of 15 slices shown (2 of 23)]
[im 1/15]
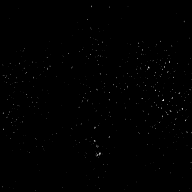

[Series 17: post t1_twist_tra_dyn-copy center · axial · 3.5mm · 0.83mm/px · 1 of 20 slices shown (4 of 24)]
[im 1/20]
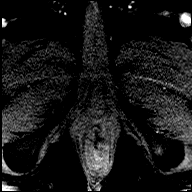

[Series 18: post t1_twist_tra_dyn-copy cent_sub_ttc=(id) · axial · 3.5mm · 0.83mm/px · 1 of 20 slices shown (3 of 23)]
[im 1/20]
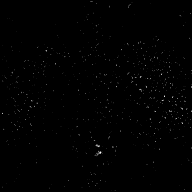

[Series 19: post t1_twist_tra_dyn-copy center · axial · 3.5mm · 0.83mm/px · 1 of 20 slices shown (5 of 24)]
[im 1/20]
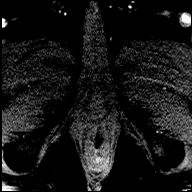

[Series 20: post t1_twist_tra_dyn-copy cent_sub_ttc=(id) · axial · 3.5mm · 0.83mm/px · 1 of 20 slices shown (4 of 23)]
[im 1/20]
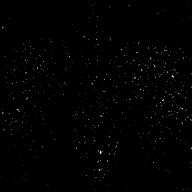

[Series 21: post t1_twist_tra_dyn-copy center · axial · 3.5mm · 0.83mm/px · 1 of 20 slices shown (6 of 24)]
[im 1/20]
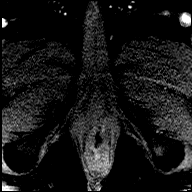

[Series 22: post t1_twist_tra_dyn-copy cent_sub_ttc=(id) · axial · 3.5mm · 0.83mm/px · 1 of 20 slices shown (5 of 23)]
[im 1/20]
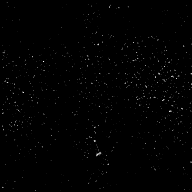

[Series 23: post t1_twist_tra_dyn-copy center · axial · 3.5mm · 0.83mm/px · 1 of 20 slices shown (7 of 24)]
[im 1/20]
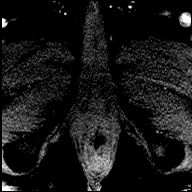

[Series 24: post t1_twist_tra_dyn-copy cent_sub_ttc=(id) · axial · 3.5mm · 0.83mm/px · 1 of 20 slices shown (6 of 23)]
[im 1/20]
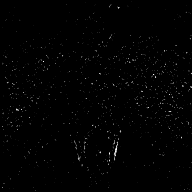

[Series 25: post t1_twist_tra_dyn-copy center · axial · 3.5mm · 0.83mm/px · 1 of 20 slices shown (8 of 24)]
[im 1/20]
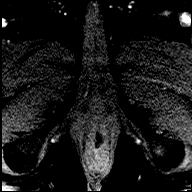

[Series 26: post t1_twist_tra_dyn-copy cent_sub_ttc=(id) · axial · 3.5mm · 0.83mm/px · 1 of 20 slices shown (7 of 23)]
[im 1/20]
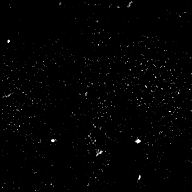

[Series 27: post t1_twist_tra_dyn-copy center · axial · 3.5mm · 0.83mm/px · 1 of 20 slices shown (9 of 24)]
[im 1/20]
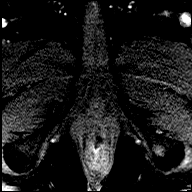

[Series 28: post t1_twist_tra_dyn-copy cent_sub_ttc=(id) · axial · 3.5mm · 0.83mm/px · 1 of 20 slices shown (8 of 23)]
[im 1/20]
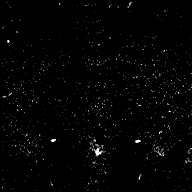

[Series 29: post t1_twist_tra_dyn-copy center · axial · 3.5mm · 0.83mm/px · 1 of 20 slices shown (10 of 24)]
[im 1/20]
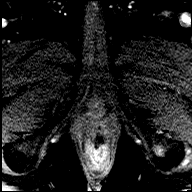

[Series 30: post t1_twist_tra_dyn-copy cent_sub_ttc=(id) · axial · 3.5mm · 0.83mm/px · 1 of 20 slices shown (9 of 23)]
[im 1/20]
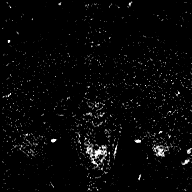

[Series 31: post t1_twist_tra_dyn-copy center · axial · 3.5mm · 0.83mm/px · 1 of 20 slices shown (11 of 24)]
[im 1/20]
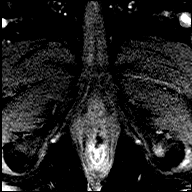

[Series 32: post t1_twist_tra_dyn-copy cent_sub_ttc=(id) · axial · 3.5mm · 0.83mm/px · 1 of 20 slices shown (10 of 23)]
[im 1/20]
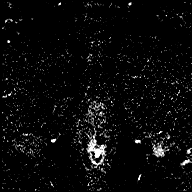

[Series 33: post t1_twist_tra_dyn-copy center · axial · 3.5mm · 0.83mm/px · 1 of 20 slices shown (12 of 24)]
[im 1/20]
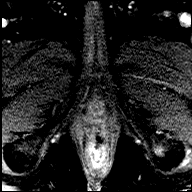

[Series 34: post t1_twist_tra_dyn-copy cent_sub_ttc=(id) · axial · 3.5mm · 0.83mm/px · 1 of 20 slices shown (11 of 23)]
[im 1/20]
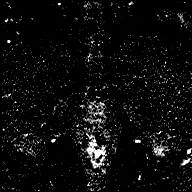

[Series 35: post t1_twist_tra_dyn-copy center · axial · 3.5mm · 0.83mm/px · 1 of 20 slices shown (13 of 24)]
[im 1/20]
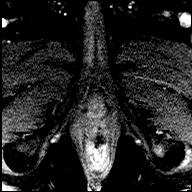

[Series 36: post t1_twist_tra_dyn-copy cent_sub_ttc=(id) · axial · 3.5mm · 0.83mm/px · 1 of 20 slices shown (12 of 23)]
[im 1/20]
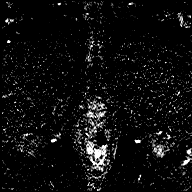

[Series 37: post t1_twist_tra_dyn-copy center · axial · 3.5mm · 0.83mm/px · 1 of 20 slices shown (14 of 24)]
[im 1/20]
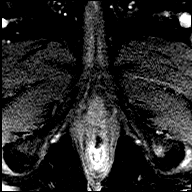

[Series 38: post t1_twist_tra_dyn-copy cent_sub_ttc=(id) · axial · 3.5mm · 0.83mm/px · 1 of 20 slices shown (13 of 23)]
[im 1/20]
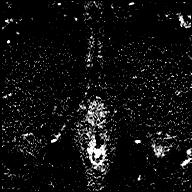

[Series 39: post t1_twist_tra_dyn-copy center · axial · 3.5mm · 0.83mm/px · 1 of 20 slices shown (15 of 24)]
[im 1/20]
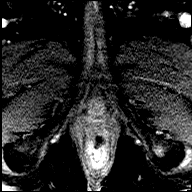

[Series 40: post t1_twist_tra_dyn-copy cent_sub_ttc=(id) · axial · 3.5mm · 0.83mm/px · 1 of 20 slices shown (14 of 23)]
[im 1/20]
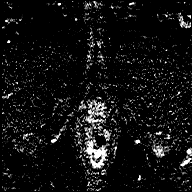

[Series 41: post t1_twist_tra_dyn-copy center · axial · 3.5mm · 0.83mm/px · 1 of 20 slices shown (16 of 24)]
[im 1/20]
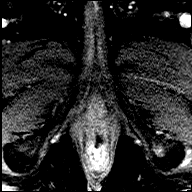

[Series 42: post t1_twist_tra_dyn-copy cent_sub_ttc=(id) · axial · 3.5mm · 0.83mm/px · 1 of 20 slices shown (15 of 23)]
[im 1/20]
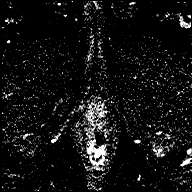

[Series 43: post t1_twist_tra_dyn-copy center · axial · 3.5mm · 0.83mm/px · 1 of 20 slices shown (17 of 24)]
[im 1/20]
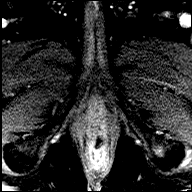

[Series 44: post t1_twist_tra_dyn-copy cent_sub_ttc=(id) · axial · 3.5mm · 0.83mm/px · 1 of 20 slices shown (16 of 23)]
[im 1/20]
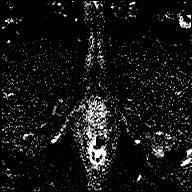

[Series 45: post t1_twist_tra_dyn-copy center · axial · 3.5mm · 0.83mm/px · 1 of 20 slices shown (18 of 24)]
[im 1/20]
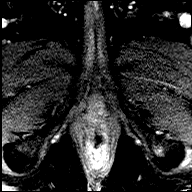

[Series 46: post t1_twist_tra_dyn-copy cent_sub_ttc=(id) · axial · 3.5mm · 0.83mm/px · 1 of 20 slices shown (17 of 23)]
[im 1/20]
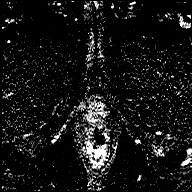

[Series 47: post t1_twist_tra_dyn-copy center · axial · 3.5mm · 0.83mm/px · 1 of 20 slices shown (19 of 24)]
[im 1/20]
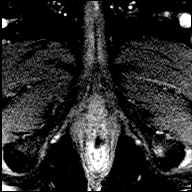

[Series 48: post t1_twist_tra_dyn-copy cent_sub_ttc=(id) · axial · 3.5mm · 0.83mm/px · 1 of 20 slices shown (18 of 23)]
[im 1/20]
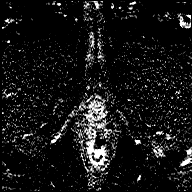

[Series 49: post t1_twist_tra_dyn-copy center · axial · 3.5mm · 0.83mm/px · 1 of 20 slices shown (20 of 24)]
[im 1/20]
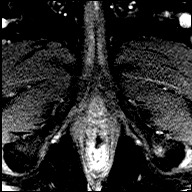

[Series 50: post t1_twist_tra_dyn-copy cent_sub_ttc=(id) · axial · 3.5mm · 0.83mm/px · 1 of 20 slices shown (19 of 23)]
[im 1/20]
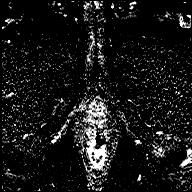

[Series 51: post t1_twist_tra_dyn-copy center · axial · 3.5mm · 0.83mm/px · 1 of 20 slices shown (21 of 24)]
[im 1/20]
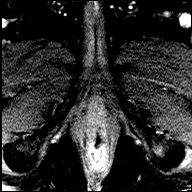

[Series 52: post t1_twist_tra_dyn-copy cent_sub_ttc=(id) · axial · 3.5mm · 0.83mm/px · 1 of 20 slices shown (20 of 23)]
[im 1/20]
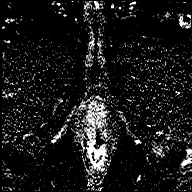

[Series 53: post t1_twist_tra_dyn-copy center · axial · 3.5mm · 0.83mm/px · 1 of 20 slices shown (22 of 24)]
[im 1/20]
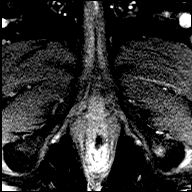

[Series 54: post t1_twist_tra_dyn-copy cent_sub_ttc=(id) · axial · 3.5mm · 0.83mm/px · 1 of 20 slices shown (21 of 23)]
[im 1/20]
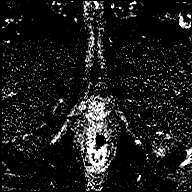

[Series 55: post t1_twist_tra_dyn-copy center · axial · 3.5mm · 0.83mm/px · 1 of 20 slices shown (23 of 24)]
[im 1/20]
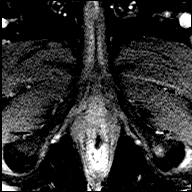

[Series 56: post t1_twist_tra_dyn-copy cent_sub_ttc=(id) · axial · 3.5mm · 0.83mm/px · 1 of 20 slices shown (22 of 23)]
[im 1/20]
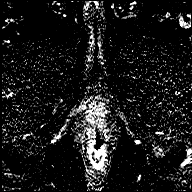

[Series 57: post t1_twist_tra_dyn-copy center · axial · 3.5mm · 0.83mm/px · 1 of 20 slices shown (24 of 24)]
[im 1/20]
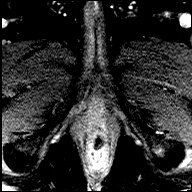

[Series 58: post t1_twist_tra_dyn-copy cent_sub_ttc=(id) · axial · 3.5mm · 0.83mm/px · 1 of 20 slices shown (23 of 23)]
[im 1/20]
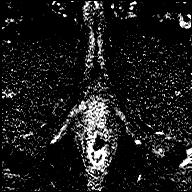

[56 of 56 positions shown; findings below may reference images not displayed]

FINDINGS: Prostate: Area of T2 hypointensity in the left mid gland tracking
towards the apex, associated with low signal on ADC and mild
capsular bulging of the left hemi prostate. This capsular bulging is
best identified on image 14 of series 9. This area has enlarged
since the previous study, measuring approximately 13 mm on the T2
weighted sequence, (image 13, series 20) previously 8-9 mm.

When measured on the ADC this area measures approximately 10 mm.
Craniocaudal extent on today's study approximately 13 mm. This is
also associated with subtle restricted diffusion on the high B value
sequence though there is limitation of the sequence secondary to
susceptibility artifact. There is early enhancement in this location
as well also appearing slightly larger than on the previous exam.

(Image 14, series 9) irregularity along the capsule adjacent to the
neurovascular bundle similar findings on the isotropic T2 weighted
images, (image 86 of series 10 along the left prosthetic margin,
also image 87. This is at approximately the 5 to 7 o'clock position.

No high-risk lesion in the transitional zone

Volume: 29 cc

Transcapsular spread: Suspected along the left prostate margin
towards the apex as described above.

Seminal vesicle involvement: Absent but with stable atrophy of the
left seminal vesicle.

Neurovascular bundle involvement: Left neurovascular bundle at risk
given findings towards the left prostate apex.

Pelvic adenopathy: Absent

Bone metastasis: Absent

Other findings: None
IMPRESSION: 1. Enlarging PIRADS category 4 area showing suspicion for
extracapsular extension and based on location neurovascular bundle
involvement.
2. No evidence of lymphadenopathy or bone metastases.
3. Stable atrophy of the left seminal vesicle.

## 2024-05-23 ENCOUNTER — Other Ambulatory Visit: Payer: Self-pay | Admitting: Urology

## 2024-06-08 ENCOUNTER — Encounter (HOSPITAL_BASED_OUTPATIENT_CLINIC_OR_DEPARTMENT_OTHER): Payer: Self-pay | Admitting: Urology

## 2024-06-08 ENCOUNTER — Other Ambulatory Visit: Payer: Self-pay

## 2024-06-14 ENCOUNTER — Encounter (HOSPITAL_BASED_OUTPATIENT_CLINIC_OR_DEPARTMENT_OTHER): Admission: RE | Payer: Self-pay | Source: Home / Self Care

## 2024-06-14 ENCOUNTER — Ambulatory Visit (HOSPITAL_BASED_OUTPATIENT_CLINIC_OR_DEPARTMENT_OTHER): Admission: RE | Admit: 2024-06-14 | Source: Home / Self Care | Admitting: Urology

## 2024-06-14 DIAGNOSIS — E119 Type 2 diabetes mellitus without complications: Secondary | ICD-10-CM

## 2024-06-14 HISTORY — DX: Male erectile dysfunction, unspecified: N52.9

## 2024-06-14 HISTORY — DX: Supraventricular tachycardia, unspecified: I47.10

## 2024-06-14 HISTORY — DX: Hyperlipidemia, unspecified: E78.5

## 2024-06-14 HISTORY — DX: Type 2 diabetes mellitus without complications: E11.9

## 2024-06-14 HISTORY — DX: Acute myocardial infarction, unspecified: I21.9

## 2024-06-14 SURGERY — INSERTION, PENILE PROSTHESIS
Anesthesia: General

## 2024-07-19 ENCOUNTER — Other Ambulatory Visit: Payer: Self-pay | Admitting: Urology

## 2024-08-16 ENCOUNTER — Other Ambulatory Visit: Payer: Self-pay

## 2024-08-16 ENCOUNTER — Encounter (HOSPITAL_BASED_OUTPATIENT_CLINIC_OR_DEPARTMENT_OTHER): Payer: Self-pay | Admitting: Urology

## 2024-08-16 NOTE — Progress Notes (Signed)
   08/16/24 1154  PAT Phone Screen  Is the patient taking a GLP-1 receptor agonist? (S)  Yes (Mounjaro LD 08-10-24)  Has the patient been informed on holding medication? Yes  Do You Have Diabetes? Yes  Do You Have Hypertension? Yes  Have You Ever Been to the ER for Asthma? No  Have You Taken Oral Steroids in the Past 3 Months? No  Do you Take Phenteramine or any Other Diet Drugs? No  Recent  Lab Work, EKG, CXR? Yes  Where was this test performed? 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 Yes  What cardiac tests were performed? Echo;Stress Test  What date/year were cardiac tests completed? 06-08-23 TEE EF 70%, Lexiscan stress test -no isch  Results viewable: Care Everywhere  Any Recent Hospitalizations? No  Height 5' 9 (1.753 m)  Weight 102.1 kg  Pat Appointment Scheduled (S)  Yes (BMP, EKG)

## 2024-08-17 ENCOUNTER — Other Ambulatory Visit: Payer: Self-pay

## 2024-08-17 ENCOUNTER — Encounter (HOSPITAL_BASED_OUTPATIENT_CLINIC_OR_DEPARTMENT_OTHER)
Admission: RE | Admit: 2024-08-17 | Discharge: 2024-08-17 | Disposition: A | Discharge status: Home / Self Care | Source: Ambulatory Visit | Attending: Urology | Admitting: Urology

## 2024-08-17 DIAGNOSIS — I10 Essential (primary) hypertension: Secondary | ICD-10-CM | POA: Insufficient documentation

## 2024-08-17 DIAGNOSIS — E119 Type 2 diabetes mellitus without complications: Secondary | ICD-10-CM | POA: Insufficient documentation

## 2024-08-17 DIAGNOSIS — Z01818 Encounter for other preprocedural examination: Secondary | ICD-10-CM | POA: Insufficient documentation

## 2024-08-17 LAB — BASIC METABOLIC PANEL WITH GFR
Anion gap: 10 (ref 5–15)
BUN: 22 mg/dL (ref 8–23)
CO2: 28 mmol/L (ref 22–32)
Calcium: 10 mg/dL (ref 8.9–10.3)
Chloride: 101 mmol/L (ref 98–111)
Creatinine, Ser: 1.57 mg/dL — ABNORMAL HIGH (ref 0.61–1.24)
GFR, Estimated: 45 mL/min — ABNORMAL LOW (ref >60)
Glucose, Bld: 159 mg/dL — ABNORMAL HIGH (ref 70–99)
Potassium: 4.9 mmol/L (ref 3.5–5.1)
Sodium: 139 mmol/L (ref 135–145)

## 2024-08-22 MED ORDER — GENTAMICIN SULFATE 40 MG/ML IJ SOLN
5.0000 mg/kg | INTRAVENOUS | Status: AC
  Administered 2024-08-23: 510 mg via INTRAVENOUS
  Filled 2024-08-22: qty 12.75

## 2024-08-22 MED ORDER — FLUCONAZOLE IN SODIUM CHLORIDE 200-0.9 MG/100ML-% IV SOLN
200.0000 mg | INTRAVENOUS | Status: DC
  Administered 2024-08-23: 200 mg via INTRAVENOUS
  Filled 2024-08-22: qty 100

## 2024-08-22 MED ORDER — VANCOMYCIN HCL 1500 MG/300ML IV SOLN
1500.0000 mg | INTRAVENOUS | Status: AC
  Administered 2024-08-23: 1500 mg via INTRAVENOUS
  Filled 2024-08-22: qty 300

## 2024-08-23 ENCOUNTER — Ambulatory Visit (HOSPITAL_BASED_OUTPATIENT_CLINIC_OR_DEPARTMENT_OTHER)
Admission: RE | Admit: 2024-08-23 | Discharge: 2024-08-23 | Disposition: A | Discharge status: Home / Self Care | Attending: Urology | Admitting: Urology

## 2024-08-23 ENCOUNTER — Other Ambulatory Visit: Payer: Self-pay

## 2024-08-23 ENCOUNTER — Encounter (HOSPITAL_BASED_OUTPATIENT_CLINIC_OR_DEPARTMENT_OTHER)
Admission: RE | Disposition: A | Discharge status: Home / Self Care | Payer: Self-pay | Source: Home / Self Care | Attending: Urology

## 2024-08-23 ENCOUNTER — Ambulatory Visit (HOSPITAL_BASED_OUTPATIENT_CLINIC_OR_DEPARTMENT_OTHER): Admitting: Anesthesiology

## 2024-08-23 ENCOUNTER — Encounter (HOSPITAL_BASED_OUTPATIENT_CLINIC_OR_DEPARTMENT_OTHER): Payer: Self-pay | Admitting: Urology

## 2024-08-23 DIAGNOSIS — K219 Gastro-esophageal reflux disease without esophagitis: Secondary | ICD-10-CM | POA: Diagnosis not present

## 2024-08-23 DIAGNOSIS — I2510 Atherosclerotic heart disease of native coronary artery without angina pectoris: Secondary | ICD-10-CM | POA: Insufficient documentation

## 2024-08-23 DIAGNOSIS — I10 Essential (primary) hypertension: Secondary | ICD-10-CM | POA: Diagnosis not present

## 2024-08-23 DIAGNOSIS — E119 Type 2 diabetes mellitus without complications: Secondary | ICD-10-CM | POA: Insufficient documentation

## 2024-08-23 DIAGNOSIS — Z7985 Long-term (current) use of injectable non-insulin antidiabetic drugs: Secondary | ICD-10-CM | POA: Insufficient documentation

## 2024-08-23 DIAGNOSIS — I252 Old myocardial infarction: Secondary | ICD-10-CM | POA: Insufficient documentation

## 2024-08-23 DIAGNOSIS — G4730 Sleep apnea, unspecified: Secondary | ICD-10-CM | POA: Diagnosis not present

## 2024-08-23 DIAGNOSIS — F419 Anxiety disorder, unspecified: Secondary | ICD-10-CM | POA: Diagnosis not present

## 2024-08-23 DIAGNOSIS — N529 Male erectile dysfunction, unspecified: Secondary | ICD-10-CM | POA: Insufficient documentation

## 2024-08-23 HISTORY — DX: Depression, unspecified: F32.A

## 2024-08-23 HISTORY — PX: PENILE PROSTHESIS IMPLANT: SHX240

## 2024-08-23 HISTORY — DX: Anxiety disorder, unspecified: F41.9

## 2024-08-23 HISTORY — DX: Sleep apnea, unspecified: G47.30

## 2024-08-23 HISTORY — DX: Gastro-esophageal reflux disease without esophagitis: K21.9

## 2024-08-23 LAB — GLUCOSE, CAPILLARY
Glucose-Capillary: 156 mg/dL — ABNORMAL HIGH (ref 70–99)
Glucose-Capillary: 113 mg/dL — ABNORMAL HIGH (ref 70–99)

## 2024-08-23 SURGERY — INSERTION, PENILE PROSTHESIS
Anesthesia: General

## 2024-08-23 MED ORDER — OXYCODONE HCL 5 MG PO TABS: 5.0000 mg | ORAL_TABLET | Freq: Four times a day (QID) | ORAL | 0 refills | Status: AC | PRN

## 2024-08-23 MED ORDER — OXYCODONE HCL 5 MG/5ML PO SOLN: 5.0000 mg | Freq: Once | ORAL | Status: DC | PRN

## 2024-08-23 MED ORDER — MUPIROCIN 2 % EX OINT
1.0000 | TOPICAL_OINTMENT | Freq: Once | CUTANEOUS | Status: AC
  Administered 2024-08-23: 1 via NASAL

## 2024-08-23 MED ORDER — FENTANYL CITRATE (PF) 100 MCG/2ML IJ SOLN: 25.0000 ug | INTRAMUSCULAR | Status: DC | PRN

## 2024-08-23 MED ORDER — ALBUMIN HUMAN 5 % IV SOLN: INTRAVENOUS | Status: DC | PRN

## 2024-08-23 MED ORDER — METRONIDAZOLE 500 MG/100ML IV SOLN: INTRAVENOUS | Status: DC | PRN

## 2024-08-23 MED ORDER — OXYCODONE HCL 5 MG PO TABS: 5.0000 mg | ORAL_TABLET | Freq: Once | ORAL | Status: DC | PRN

## 2024-08-23 MED ORDER — ACETAMINOPHEN 500 MG PO TABS
1000.0000 mg | ORAL_TABLET | Freq: Once | ORAL | Status: AC
  Administered 2024-08-23: 1000 mg via ORAL

## 2024-08-23 MED ORDER — SODIUM CHLORIDE 0.9 % IV SOLN
INTRAVENOUS | Status: DC
  Filled 2024-08-23 (×2): qty 10

## 2024-08-23 MED ORDER — CHLORHEXIDINE GLUCONATE 4 % EX SOLN: Freq: Once | CUTANEOUS | Status: AC

## 2024-08-23 MED ORDER — LACTATED RINGERS IV SOLN: INTRAVENOUS | Status: DC

## 2024-08-23 MED ORDER — ACETAMINOPHEN 10 MG/ML IV SOLN: 1000.0000 mg | Freq: Once | INTRAVENOUS | Status: DC | PRN

## 2024-08-23 MED ORDER — KETOROLAC TROMETHAMINE 15 MG/ML IJ SOLN
15.0000 mg | Freq: Once | INTRAMUSCULAR | Status: AC
  Administered 2024-08-23: 15 mg via INTRAVENOUS

## 2024-08-23 MED ORDER — DEXMEDETOMIDINE HCL IN NACL 80 MCG/20ML IV SOLN
INTRAVENOUS | Status: DC | PRN
Start: 2024-08-23 — End: 2024-08-23
  Administered 2024-08-23: 4 ug via INTRAVENOUS

## 2024-08-23 MED ORDER — FENTANYL CITRATE (PF) 100 MCG/2ML IJ SOLN
INTRAMUSCULAR | Status: AC
  Filled 2024-08-23: qty 2

## 2024-08-23 MED ORDER — PHENYLEPHRINE HCL (PRESSORS) 10 MG/ML IV SOLN
INTRAVENOUS | Status: DC | PRN
  Administered 2024-08-23 (×2): 80 ug via INTRAVENOUS

## 2024-08-23 MED ORDER — LIDOCAINE HCL 1 % IJ SOLN
INTRAMUSCULAR | Status: DC | PRN
  Administered 2024-08-23: 20 mL via SURGICAL_CAVITY

## 2024-08-23 MED ORDER — LIDOCAINE HCL (CARDIAC) PF 100 MG/5ML IV SOSY
PREFILLED_SYRINGE | INTRAVENOUS | Status: DC | PRN
  Administered 2024-08-23: 60 mg via INTRAVENOUS

## 2024-08-23 MED ORDER — EPHEDRINE SULFATE (PRESSORS) 25 MG/5ML IV SOSY: PREFILLED_SYRINGE | INTRAVENOUS | Status: DC | PRN

## 2024-08-23 MED ORDER — PHENYLEPHRINE 80 MCG/ML (10ML) SYRINGE FOR IV PUSH (FOR BLOOD PRESSURE SUPPORT)
PREFILLED_SYRINGE | INTRAVENOUS | Status: AC
  Filled 2024-08-23: qty 30

## 2024-08-23 MED ORDER — SODIUM CHLORIDE 0.9 % IR SOLN
Status: DC | PRN
  Administered 2024-08-23: 1000 mL

## 2024-08-23 MED ORDER — MUPIROCIN 2 % EX OINT
TOPICAL_OINTMENT | CUTANEOUS | Status: AC
Start: 2024-08-23 — End: 2024-08-23
  Filled 2024-08-23: qty 22

## 2024-08-23 MED ORDER — ACETAMINOPHEN 500 MG PO TABS: 1000.0000 mg | ORAL_TABLET | Freq: Three times a day (TID) | ORAL | 0 refills | Status: AC

## 2024-08-23 MED ORDER — EPHEDRINE 5 MG/ML INJ
INTRAVENOUS | Status: AC
  Filled 2024-08-23: qty 10

## 2024-08-23 MED ORDER — ACETAMINOPHEN 500 MG PO TABS
ORAL_TABLET | ORAL | Status: AC
Start: 2024-08-23 — End: 2024-08-23
  Filled 2024-08-23: qty 2

## 2024-08-23 MED ORDER — IRRISEPT - 450ML BOTTLE WITH 0.05% CHG IN STERILE WATER, USP 99.95% OPTIME
TOPICAL | Status: DC | PRN
Start: 2024-08-23 — End: 2024-08-23
  Administered 2024-08-23: 900 mL
  Administered 2024-08-23: 450 mL

## 2024-08-23 MED ORDER — MIDAZOLAM HCL 2 MG/2ML IJ SOLN
INTRAMUSCULAR | Status: AC
Start: 2024-08-23 — End: 2024-08-23
  Filled 2024-08-23: qty 2

## 2024-08-23 MED ORDER — DROPERIDOL 2.5 MG/ML IJ SOLN
0.6250 mg | Freq: Once | INTRAMUSCULAR | Status: DC | PRN
Start: 2024-08-23 — End: 2024-08-23

## 2024-08-23 MED ORDER — KETOROLAC TROMETHAMINE 15 MG/ML IJ SOLN
INTRAMUSCULAR | Status: AC
  Filled 2024-08-23: qty 1

## 2024-08-23 MED ORDER — RIFAMPIN 600 MG IV SOLR
INTRAVENOUS | Status: DC | PRN
  Administered 2024-08-23: 100 mL

## 2024-08-23 MED ORDER — MIDAZOLAM HCL (PF) 2 MG/2ML IJ SOLN
INTRAMUSCULAR | Status: DC | PRN
Start: 2024-08-23 — End: 2024-08-23
  Administered 2024-08-23: 1 mg via INTRAVENOUS

## 2024-08-23 MED ORDER — PROPOFOL 10 MG/ML IV BOLUS
INTRAVENOUS | Status: DC | PRN
  Administered 2024-08-23: 250 mg via INTRAVENOUS

## 2024-08-23 MED ORDER — PHENYLEPHRINE HCL-NACL 20-0.9 MG/250ML-% IV SOLN
INTRAVENOUS | Status: DC | PRN
  Administered 2024-08-23: 25 ug/min via INTRAVENOUS

## 2024-08-23 MED ORDER — SULFAMETHOXAZOLE-TRIMETHOPRIM 800-160 MG PO TABS: 1.0000 | ORAL_TABLET | Freq: Two times a day (BID) | ORAL | 0 refills | Status: AC

## 2024-08-23 MED ORDER — ONDANSETRON HCL 4 MG/2ML IJ SOLN
INTRAMUSCULAR | Status: AC
  Filled 2024-08-23: qty 2

## 2024-08-23 MED ORDER — EPHEDRINE SULFATE (PRESSORS) 25 MG/5ML IV SOSY
PREFILLED_SYRINGE | INTRAVENOUS | Status: DC | PRN
  Administered 2024-08-23: 10 mg via INTRAVENOUS
  Administered 2024-08-23 (×3): 5 mg via INTRAVENOUS

## 2024-08-23 MED ORDER — ONDANSETRON HCL 4 MG/2ML IJ SOLN
INTRAMUSCULAR | Status: DC | PRN
  Administered 2024-08-23: 4 mg via INTRAVENOUS

## 2024-08-23 MED ORDER — FENTANYL CITRATE (PF) 100 MCG/2ML IJ SOLN
INTRAMUSCULAR | Status: DC | PRN
  Administered 2024-08-23: 50 ug via INTRAVENOUS

## 2024-08-23 MED ORDER — CELECOXIB 200 MG PO CAPS: 200.0000 mg | ORAL_CAPSULE | Freq: Two times a day (BID) | ORAL | 1 refills | Status: AC | PRN

## 2024-08-23 SURGICAL SUPPLY — 50 items
BLADE SURG 15 STRL LF DISP TIS (BLADE) ×1 IMPLANT
BNDG GAUZE DERMACEA FLUFF 4 (GAUZE/BANDAGES/DRESSINGS) ×1 IMPLANT
BRIEF MESH DISP 2XL (UNDERPADS AND DIAPERS) ×1 IMPLANT
CATH COUDE 5CC RIBBED (CATHETERS) ×1 IMPLANT
CHLORAPREP W/TINT 26 (MISCELLANEOUS) ×2 IMPLANT
COVER BACK TABLE 60X90IN (DRAPES) ×1 IMPLANT
COVER MAYO STAND STRL (DRAPES) ×2 IMPLANT
DERMABOND ADVANCED .7 DNX12 (GAUZE/BANDAGES/DRESSINGS) ×1 IMPLANT
DRAIN CHANNEL 10F 3/8 F FF (DRAIN) ×1 IMPLANT
DRAPE INCISE IOBAN 66X45 STRL (DRAPES) ×1 IMPLANT
DRAPE LAPAROTOMY 100X72 PEDS (DRAPES) ×1 IMPLANT
DRAPE UTILITY XL STRL (DRAPES) ×1 IMPLANT
DRSG TEGADERM 4X4.75 (GAUZE/BANDAGES/DRESSINGS) ×1 IMPLANT
EVACUATOR SILICONE 100CC (DRAIN) ×1 IMPLANT
GAUZE 4X4 16PLY ~~LOC~~+RFID DBL (SPONGE) IMPLANT
GAUZE SPONGE 4X4 12PLY STRL (GAUZE/BANDAGES/DRESSINGS) ×1 IMPLANT
GLOVE BIO SURGEON STRL SZ7 (GLOVE) ×1 IMPLANT
GLOVE BIOGEL PI IND STRL 7.0 (GLOVE) ×1 IMPLANT
GOWN STRL REUS W/TWL LRG LVL3 (GOWN DISPOSABLE) ×1 IMPLANT
HIBICLENS CHG 4% 4OZ BTL (MISCELLANEOUS) IMPLANT
HOLDER FOLEY CATH W/STRAP (MISCELLANEOUS) IMPLANT
KIT TITAN ASSEMBLY W TOOLS (Erectile Restoration) IMPLANT
LAVAGE JET IRRISEPT WOUND (IRRIGATION / IRRIGATOR) ×2 IMPLANT
NDL HYPO 22X1.5 SAFETY MO (MISCELLANEOUS) ×1 IMPLANT
NEEDLE HYPO 22X1.5 SAFETY MO (MISCELLANEOUS) ×1 IMPLANT
PACK BASIN DAY SURGERY FS (CUSTOM PROCEDURE TRAY) ×1 IMPLANT
PENCIL SMOKE EVACUATOR (MISCELLANEOUS) ×1 IMPLANT
PLUG CATH AND CAP STRL 200 (CATHETERS) ×1 IMPLANT
PROSTHESIS CYLINDER 24 (Erectile Restoration) IMPLANT
PUMP TITAN TOUCH (Stimulator) IMPLANT
RESERVOIR TITAN 12.5CC W/VLV (Erectile Restoration) IMPLANT
RETRACTOR WILSON COLOPLAST (INSTRUMENTS) IMPLANT
RETRACTOR WILSON SYSTEM (INSTRUMENTS) IMPLANT
SET COLLECT BLD 21X.75 12 PB G (NEEDLE) ×1 IMPLANT
SLEEVE SCD COMPRESS KNEE MED (STOCKING) ×1 IMPLANT
SOLN 0.9% NACL POUR BTL 1000ML (IV SOLUTION) IMPLANT
SOLN STERILE WATER BTL 1000 ML (IV SOLUTION) ×1 IMPLANT
SUT ETHILON 3 0 PS 1 (SUTURE) ×1 IMPLANT
SUT MNCRL AB 4-0 PS2 18 (SUTURE) ×1 IMPLANT
SUT VIC AB 2-0 UR6 27 (SUTURE) ×4 IMPLANT
SUT VIC AB 3-0 SH 27X BRD (SUTURE) ×2 IMPLANT
SYR 10ML LL (SYRINGE) ×3 IMPLANT
SYR 50ML LL SCALE MARK (SYRINGE) ×3 IMPLANT
SYR BULB IRRIG 60ML STRL (SYRINGE) ×1 IMPLANT
SYR CONTROL 10ML LL (SYRINGE) ×1 IMPLANT
TIP REAR EXTND STD TITAN (Erectile Restoration) IMPLANT
TOWEL GREEN STERILE FF (TOWEL DISPOSABLE) ×2 IMPLANT
TRAY DSU PREP LF (CUSTOM PROCEDURE TRAY) ×1 IMPLANT
TUBE CONNECTING 20X1/4 (TUBING) ×1 IMPLANT
YANKAUER SUCT BULB TIP NO VENT (SUCTIONS) ×1 IMPLANT

## 2024-08-23 NOTE — H&P (Signed)
 H&P  History of Present Illness: Brian Petersen is a 78 y.o. year old M who presents today for insertion of an inflatable penile prosthesis  No acute complaints  Past Medical History:  Diagnosis Date   Anxiety    Depression    Diabetes (HCC)    Erectile dysfunction    GERD (gastroesophageal reflux disease)    Heart attack (HCC)    per patient   Hyperlipidemia    Hypertension    Prostate cancer (HCC)    Sleep apnea    on CPAP   SVT (supraventricular tachycardia)    on metoprolol    Past Surgical History:  Procedure Laterality Date   FOOT SURGERY     KNEE ARTHROSCOPY     PROSTATE BIOPSY     2018, 2014, 2012, 2012    Home Medications:  Current Meds  Medication Sig   ALPRAZolam (XANAX) 1 MG tablet    amLODipine (NORVASC) 5 MG tablet    aspirin EC 81 MG tablet Take 81 mg by mouth daily. Swallow whole.   ezetimibe (ZETIA) 10 MG tablet    fenofibrate (TRICOR) 48 MG tablet    furosemide (LASIX) 20 MG tablet    Glucosamine-Chondroitin 250-200 MG TABS Take by mouth.   hydrALAZINE (APRESOLINE) 100 MG tablet    KLOR-CON M20 20 MEQ tablet    losartan-hydrochlorothiazide (HYZAAR) 100-25 MG tablet    Magnesium Chloride 64 MG TBEC Take by mouth.   metoprolol succinate (TOPROL-XL) 50 MG 24 hr tablet    pantoprazole (PROTONIX) 40 MG tablet    rosuvastatin (CRESTOR) 20 MG tablet    sotalol (BETAPACE) 80 MG tablet Take 80 mg by mouth 2 (two) times daily.   tirzepatide (MOUNJARO) 12.5 MG/0.5ML Pen Inject 12.5 mg into the skin once a week.    Allergies:  Allergies  Allergen Reactions   Cyprodenate     Family History  Problem Relation Age of Onset   Colon cancer Mother    Prostate cancer Neg Hx    Pancreatic cancer Neg Hx    Breast cancer Neg Hx     Social History:  reports that he has never smoked. He has never used smokeless tobacco. He reports that he does not currently use alcohol. He reports that he does not use drugs.  ROS: A complete review of systems was  performed.  All systems are negative except for pertinent findings as noted.  Physical Exam:  Vital signs in last 24 hours: Temp:  [98 F (36.7 C)] 98 F (36.7 C) (11/05 0924) Pulse Rate:  [55] 55 (11/05 0924) Resp:  [14] 14 (11/05 0924) BP: (141)/(75) 141/75 (11/05 0924) SpO2:  [97 %] 97 % (11/05 0924) Weight:  [103.6 kg] 103.6 kg (11/05 0924) Constitutional:  Alert and oriented, No acute distress Cardiovascular: Regular rate and rhythm Respiratory: Normal respiratory effort, Lungs clear bilaterally GI: Abdomen is soft, nontender, nondistended, no abdominal masses Lymphatic: No lymphadenopathy Neurologic: Grossly intact, no focal deficits Psychiatric: Normal mood and affect   Laboratory Data:  No results for input(s): WBC, HGB, HCT, PLT in the last 72 hours.  No results for input(s): NA, K, CL, GLUCOSE, BUN, CALCIUM, CREATININE in the last 72 hours.  Invalid input(s): CO3   Results for orders placed or performed during the hospital encounter of 08/23/24 (from the past 24 hours)  Glucose, capillary     Status: Abnormal   Collection Time: 08/23/24  9:29 AM  Result Value Ref Range   Glucose-Capillary 156 (H) 70 - 99  mg/dL   No results found for this or any previous visit (from the past 240 hours).  Renal Function: Recent Labs    08/17/24 1300  CREATININE 1.57*   Estimated Creatinine Clearance: 47.5 mL/min (A) (by C-G formula based on SCr of 1.57 mg/dL (H)).  Radiologic Imaging: No results found.  Assessment:  Brian Petersen is a 78 y.o. year old M with ED refractory to other medical treatments  Plan:  To OR as planned for IPP. Procedure and risks reviewed, including but not limited to bleeding, infection, implant infection, implant malfunction, implant malplacement, erosion, damage to adjacent structures, pain, urinary retention. All questions answered   Herlene Foot, MD 08/23/2024, 10:12 AM  [facility] Pager: [phone]

## 2024-08-23 NOTE — Transfer of Care (Signed)
 Immediate Anesthesia Transfer of Care Note  Patient: Brian Petersen  Procedure(s) Performed: INSERTION, PENILE PROSTHESIS  Patient Location: PACU  Anesthesia Type:General  Level of Consciousness: drowsy and patient cooperative  Airway & Oxygen Therapy: Patient Spontanous Breathing and Patient connected to nasal cannula oxygen  Post-op Assessment: Report given to RN and Post -op Vital signs reviewed and stable  Post vital signs: Reviewed and stable  Last Vitals:  Vitals Value Taken Time  BP 118/56 08/23/24 13:11  Temp 36.2 C 08/23/24 13:11  Pulse 59 08/23/24 13:13  Resp 17 08/23/24 13:13  SpO2 94 % 08/23/24 13:13  Vitals shown include unfiled device data.  Last Pain:  Vitals:   08/23/24 0924  TempSrc: Temporal  PainSc: 0-No pain      Patients Stated Pain Goal: 4 (08/23/24 0924)  Complications: No notable events documented.

## 2024-08-23 NOTE — Op Note (Signed)
 PATIENT:  Brian Petersen  PRE-OPERATIVE DIAGNOSIS:  Organic erectile dysfunction  POST-OPERATIVE DIAGNOSIS:  Same  PROCEDURE:   3 piece inflatable penile prosthesis (coloplast) Injection of pharmacoagent into penis  SURGEON:  Herlene Foot MD  ASST: none  INDICATION: He has had long-standing organic erectile dysfunction and refractory to other modes of treatment. He has elected to proceed with prosthesis implantation.  ANESTHESIA:  General  EBL:  50 cc  Device: 3 piece coloplast titan: 120 cc reservoir, 24 cm cylinders and 1 cm rear-tip extenders on right and left sides  LOCAL MEDICATIONS USED:   penile block done with 10 cc lidocaine/marcaine mixture 10 cc injected into corpora directly via butterfly needle  SPECIMEN: None  Description of procedure: The patient was taken to the major operating room, placed on the table and administered general anesthesia in the supine position. His genitalia was then prepped with chlorhexidine x 2. He was draped in the usual sterile fashion, and I used Ioban on the field. An official timeout was then performed.  A dorsal penile block was performed. A butterfly needle was then used to inject normal saline into the penis to give an artificial erection. There mild dorsal/leftward curvature. I then injected 10 cc of lidocaine/marcaine into the penis.   A 14 French coude catheter was then placed in the bladder and the bladder was drained and the catheter was plugged. A midline penoscrotal incision was then made and the dissection was carried down to the corpora and urethra. The lonestar retractor was positioned so as to have excellent exposure. 2-0 Vicryl sutures were then placed proximally in each corpus cavernosum to serve as stay sutures. An incision was then made in the corpus cavernosum first on the left-hand side with the bovie. Tamra were used to gently dilate the opening. I then dilated the corpus cavernosum with the a 12 Fr brooks dilator distally  and proximally. Field goal post tests were performed and there was no evidence of perforations or crossover. I then irrigated the corpus cavernosum with antibiotic solution and measured the distance proximally and distally from the stay suture and was found to be 10.5 and 13 cm, respectively.I then turned my attention to the contralateral corpus cavernosum and placed my stay sutures, made my corporotomy and dilated the corpus cavernosum in an identical fashion. This was measured and also was found to be 10.5 cm proximally and 13 distally. It was irrigated with anastomotic solution as was the scrotum. I then chose an 24 cm cylinder set with 1 cm rear-tip extenders and these were prepped while I prepared the site for reservoir placement.  I then digitally probed into the right external inguinal ring. My finger was used to poke through the posterior wall of the ring. I used my finger to ensure I was in the appropriate space, and to clear room for the reservoir. I irrigated the space with anastomotic solution and then placed the reservoir in this location. I then filled the reservoir with 120 cc of sterile saline, and checked to confirm proper position. There was minimal backpressure with the reservoir max-filled.  Attention was redirected to the corporotomies where the cylinders were then placed by first fixing the suture to the distal aspect of the right cylinder to a straight needle. This was then loaded on the [facility] inserter and passed through the corporotomy and distally. I then advanced the straight needle with the Furlow inserter out through the glans and this was grasped with a hemostat and pulled through the glans  and the suture was secured with a hemostat. I then performed an identical maneuver on the contralateral side. After this was performed I irrigated both corpus cavernosum; there was no evidence of urethral perforation. I inserted the distal portion of the cylinder through the corporotomies and  pulled this to the end of the corpora with the suture. The proximal aspect with the rear-tip extender was then passed through the corporotomy and into the seated position on each side. I then connected reservoir tubing to a syringe filled with sterile saline and inflated the device. I noted a good straight erection with both cylinders equidistant under the glans and no buckling of the cylinders. I therefore deflated the device and closed the corporotomies with used my previously placed stay sutures.   I then grasped the scrotal skin in the midline with a babcock, and used a hemostat to dissect down to the dependent-most portion of the scrotum. The nasal speculum was inserted into this space, and facilitated placement of the pump. The cylinder was then connected to the pump after excising the excess tubing with appropriate shodded hemostats in place and then I used the supplied connectors to make the connection. I then again cycled the device with the pump and it cycled properly. I deflated the device and pumped it up about three quarters of the way to aid with hemostasis. I irrigated the wound one last time with antibiotic irrigation and then closed the deep scrotal tissue over the tubing and pump with running 3-0 monocryl suture. I placed a 10 Fr blake drain over the corporotomies. A second layer was then closed over this first layer with running 3- 0 monocryl, and running skin suture w/ 4-0 monocryl performed. Incision dressed with dermabond.  A mummy wrap was applied. The catheter was removed, and drain connected to suction bulb and the patient was awakened and taken recovery room in stable and satisfactory condition. He tolerated the procedure well and there were no intraoperative complications. Needle sponge and instrument counts were correct at the end of the operation.

## 2024-08-23 NOTE — Discharge Instructions (Addendum)
 Penile prosthesis postoperative instructions  Wound:  In most cases your incision will have absorbable sutures that will dissolve within the first 10-20 days. Some will fall out even earlier. Expect some redness as the sutures dissolved but this should occur only around the sutures. If there is generalized redness, especially with increasing pain or swelling, let us  know. The scrotum and penis will very likely get black and blue as the blood in the tissues spread. Sometimes the whole scrotum will turn colors. The black and blue is followed by a yellow and brown color. In time, all the discoloration will go away. In some cases some firm swelling in the area of the testicle and pump may persist for up to 4-6 weeks after the surgery and is considered normal in most cases.  Drain:   You may be discharged home with a drain in place. If so, you will be taught how to empty it and should keep track of the output. Additionally, you should call the office to arrange for an appointment to have it removed after a few days.   Diet:  You may return to your normal diet within 24 hours following your surgery. You may note some mild nausea and possibly vomiting the first 6-8 hours following surgery. This is usually due to the side effects of anesthesia, and will disappear quite soon. I would suggest clear liquids and a very light meal the first evening following your surgery.  Activity:  Your physical activity should be restricted the first 48 hours. During that time you should remain relatively inactive, moving about only when necessary. During the first 3 weeks following surgery you should avoid lifting any heavy objects (anything greater than 15 pounds), and avoid strenuous exercise. If you work, ask us  specifically about your restrictions, both for work and home. We will write a note to your employer if needed.  Avoid using your penis until your follow up visit with Dr Lovie, which will typically be around  3-4 weeks following the surgery. Most people are able to start cycling their device after that appointment, and can have intercourse soon thereafter.   You should plan to wear a tight pair of jockey shorts or an athletic supporter for the first 4-5 days, even to sleep. This will keep the scrotum immobilized to some degree and keep the swelling down.The position of your penis will determine what is most comfortable but I strongly urge you to keep the penis in the up position (toward your head). You should continue to tuck up your penis when possible for the first 3 months following surgery.  Ice packs should be placed on and off over the scrotum for the first 48 hours. Frozen peas or corn in a ZipLock bag can be frozen, used and re-frozen. Fifteen minutes on and 15 minutes off is a reasonable schedule. The ice is a good pain reliever and keeps the swelling down.  Hygiene:  You may shower 48 hours after your surgery. Tub bathing should be restricted until the wound is completely healed, typically around 2-3 weeks.  Medication:  You will be sent home with some type of pain medication. In many cases you will be sent home with a strong anti-inflammatory medication (Celebrex, Meloxicam) and a narcotic pain pill (hydrocodone or oxycodone). You can also supplement these medications with tylenol (acetaminophen). If the pain medication you are sent home with does not control the pain, please notify the office Problems you should report to us :  Fever of 101.0 degrees  Fahrenheit or greater. Moderate or severe swelling under the skin incision or involving the scrotum. Drug reaction such as hives, a rash, nausea or vomiting.   Post Anesthesia Home Care Instructions  Activity: Get plenty of rest for the remainder of the day. A responsible individual must stay with you for 24 hours following the procedure.  For the next 24 hours, DO NOT: -Drive a car -Advertising copywriter -Drink alcoholic  beverages -Take any medication unless instructed by your physician -Make any legal decisions or sign important papers.  Meals: Start with liquid foods such as gelatin or soup. Progress to regular foods as tolerated. Avoid greasy, spicy, heavy foods. If nausea and/or vomiting occur, drink only clear liquids until the nausea and/or vomiting subsides. Call your physician if vomiting continues.  Special Instructions/Symptoms: Your throat may feel dry or sore from the anesthesia or the breathing tube placed in your throat during surgery. If this causes discomfort, gargle with warm salt water. The discomfort should disappear within 24 hours.  If you had a scopolamine patch placed behind your ear for the management of post- operative nausea and/or vomiting:  1. The medication in the patch is effective for 72 hours, after which it should be removed.  Wrap patch in a tissue and discard in the trash. Wash hands thoroughly with soap and water. 2. You may remove the patch earlier than 72 hours if you experience unpleasant side effects which may include dry mouth, dizziness or visual disturbances. 3. Avoid touching the patch. Wash your hands with soap and water after contact with the patch.    Next dose of tylenl 3:30pm

## 2024-08-23 NOTE — Anesthesia Preprocedure Evaluation (Addendum)
 Anesthesia Evaluation  Patient identified by MRN, date of birth, ID band Patient awake    Reviewed: Allergy & Precautions, NPO status , Patient's Chart, lab work & pertinent test results  Airway Mallampati: II  TM Distance: >3 FB Neck ROM: Full    Dental  (+) Teeth Intact, Dental Advisory Given   Pulmonary sleep apnea    breath sounds clear to auscultation       Cardiovascular hypertension, Pt. on medications and Pt. on home beta blockers + CAD and + Past MI   Rhythm:Regular Rate:Normal  Echo (2024):  1. Overall left ventricular ejection fraction is estimated at 60 to 65%.   2. Moderate concentric left ventricular hypertrophy.   3. Normal left ventricular diastolic filling.   4. Elevated left ventricular end diastolic pressure by tissue Doppler.   Nuc Stress Test: 1. Abnormal nuclear stress test without evidence of myocardial ischemia or evidence of prior myocardial infarction. There was a drop in EF on stress imaging which may have been due to hypertensive response on stress testing.  2. Normal wall motion with EF of 52%  3. High-risk markers include: None     Neuro/Psych  PSYCHIATRIC DISORDERS Anxiety Depression    negative neurological ROS     GI/Hepatic Neg liver ROS,GERD  Medicated,,  Endo/Other  diabetes    Renal/GU negative Renal ROS     Musculoskeletal negative musculoskeletal ROS (+)    Abdominal   Peds  Hematology negative hematology ROS (+)   Anesthesia Other Findings   Reproductive/Obstetrics                              Anesthesia Physical Anesthesia Plan  ASA: 3  Anesthesia Plan: General   Post-op Pain Management: Tylenol PO (pre-op)*   Induction: Intravenous  PONV Risk Score and Plan: 3 and Ondansetron, Dexamethasone and Midazolam  Airway Management Planned: LMA  Additional Equipment: None  Intra-op Plan:   Post-operative Plan: Extubation in  OR  Informed Consent: I have reviewed the patients History and Physical, chart, labs and discussed the procedure including the risks, benefits and alternatives for the proposed anesthesia with the patient or authorized representative who has indicated his/her understanding and acceptance.     Dental advisory given  Plan Discussed with: CRNA  Anesthesia Plan Comments:          Anesthesia Quick Evaluation

## 2024-08-23 NOTE — Anesthesia Postprocedure Evaluation (Signed)
 Anesthesia Post Note  Patient: Brian Petersen  Procedure(s) Performed: INSERTION, PENILE PROSTHESIS     Patient location during evaluation: PACU Anesthesia Type: General Level of consciousness: awake and alert Pain management: pain level controlled Vital Signs Assessment: post-procedure vital signs reviewed and stable Respiratory status: spontaneous breathing, nonlabored ventilation, respiratory function stable and patient connected to nasal cannula oxygen Cardiovascular status: blood pressure returned to baseline and stable Postop Assessment: no apparent nausea or vomiting Anesthetic complications: no   No notable events documented.  Last Vitals:  Vitals:   08/23/24 1315 08/23/24 1330  BP: (!) 124/59 101/84  Pulse: (!) 57 (!) 59  Resp: 16 10  Temp:    SpO2: 95% 96%    Last Pain:  Vitals:   08/23/24 0924  TempSrc: Temporal  PainSc: 0-No pain                 Franky JONETTA Bald

## 2024-08-23 NOTE — Anesthesia Procedure Notes (Signed)
 Procedure Name: LMA Insertion Date/Time: 08/23/2024 11:05 AM  Performed by: Donnell Berwyn SQUIBB, CRNAPre-anesthesia Checklist: Patient identified, Emergency Drugs available, Suction available and Patient being monitored Patient Re-evaluated:Patient Re-evaluated prior to induction Oxygen Delivery Method: Circle system utilized Preoxygenation: Pre-oxygenation with 100% oxygen Induction Type: IV induction LMA: LMA inserted LMA Size: 5.0 Number of attempts: 1 Placement Confirmation: positive ETCO2 and breath sounds checked- equal and bilateral Tube secured with: Tape Dental Injury: Teeth and Oropharynx as per pre-operative assessment

## 2024-08-24 ENCOUNTER — Encounter (HOSPITAL_BASED_OUTPATIENT_CLINIC_OR_DEPARTMENT_OTHER): Payer: Self-pay | Admitting: Urology
